# Patient Record
Sex: Male | Born: 1935 | Hispanic: No | Marital: Married | Smoking: Former smoker
Health system: Southern US, Community
[De-identification: ages and names within clinical notes are randomized; demographics above are authoritative.]

## PROBLEM LIST (undated history)

## (undated) DIAGNOSIS — I509 Heart failure, unspecified: Secondary | ICD-10-CM

## (undated) DIAGNOSIS — I459 Conduction disorder, unspecified: Secondary | ICD-10-CM

## (undated) DIAGNOSIS — N189 Chronic kidney disease, unspecified: Secondary | ICD-10-CM

## (undated) DIAGNOSIS — H9190 Unspecified hearing loss, unspecified ear: Secondary | ICD-10-CM

## (undated) DIAGNOSIS — I10 Essential (primary) hypertension: Secondary | ICD-10-CM

## (undated) HISTORY — DX: Conduction disorder, unspecified: I45.9

## (undated) HISTORY — DX: Essential (primary) hypertension: I10

## (undated) HISTORY — DX: Heart failure, unspecified: I50.9

## (undated) HISTORY — DX: Chronic kidney disease, unspecified: N18.9

## (undated) HISTORY — PX: HEMORRHOID SURGERY: SHX153

## (undated) HISTORY — DX: Unspecified hearing loss, unspecified ear: H91.90

---

## 2008-02-05 ENCOUNTER — Ambulatory Visit: Payer: Self-pay | Admitting: Internal Medicine

## 2008-02-05 ENCOUNTER — Inpatient Hospital Stay: Payer: Self-pay | Admitting: Internal Medicine

## 2008-02-05 ENCOUNTER — Other Ambulatory Visit: Payer: Self-pay

## 2008-05-24 ENCOUNTER — Ambulatory Visit: Payer: Self-pay | Admitting: Internal Medicine

## 2008-08-13 ENCOUNTER — Encounter (INDEPENDENT_AMBULATORY_CARE_PROVIDER_SITE_OTHER): Payer: Self-pay

## 2009-03-21 ENCOUNTER — Ambulatory Visit: Payer: Self-pay | Admitting: Internal Medicine

## 2009-03-21 DIAGNOSIS — Z950 Presence of cardiac pacemaker: Secondary | ICD-10-CM

## 2009-03-21 DIAGNOSIS — I110 Hypertensive heart disease with heart failure: Secondary | ICD-10-CM | POA: Insufficient documentation

## 2009-03-21 DIAGNOSIS — I442 Atrioventricular block, complete: Secondary | ICD-10-CM | POA: Insufficient documentation

## 2009-03-21 DIAGNOSIS — I509 Heart failure, unspecified: Secondary | ICD-10-CM | POA: Insufficient documentation

## 2009-06-22 ENCOUNTER — Ambulatory Visit: Payer: Self-pay | Admitting: Internal Medicine

## 2009-07-11 ENCOUNTER — Encounter: Payer: Self-pay | Admitting: Internal Medicine

## 2009-09-21 ENCOUNTER — Ambulatory Visit: Payer: Self-pay | Admitting: Internal Medicine

## 2009-10-06 ENCOUNTER — Encounter: Payer: Self-pay | Admitting: Internal Medicine

## 2009-12-22 ENCOUNTER — Ambulatory Visit: Payer: Self-pay | Admitting: Internal Medicine

## 2009-12-26 ENCOUNTER — Encounter: Payer: Self-pay | Admitting: Internal Medicine

## 2010-03-03 ENCOUNTER — Encounter (INDEPENDENT_AMBULATORY_CARE_PROVIDER_SITE_OTHER): Payer: Self-pay | Admitting: *Deleted

## 2010-05-15 ENCOUNTER — Ambulatory Visit
Admission: RE | Admit: 2010-05-15 | Discharge: 2010-05-15 | Payer: Self-pay | Source: Home / Self Care | Attending: Internal Medicine | Admitting: Internal Medicine

## 2010-06-06 NOTE — Letter (Signed)
Summary: Remote Device Check  [employer], Main Office  [address]   [place], [place] [address]   Phone: [phone]  Fax: [phone]     December 26, 2009 MRN: [id number]   [facility] [address] [place], [place]  [address]   Dear Mr. Turbyfill,   Your remote transmission was recieved and reviewed by your physician.  All diagnostics were within normal limits for you.  _____Your next transmission is scheduled for:                       .  Please transmit at any time this day.  If you have a wireless device your transmission will be sent automatically.  __X____Your next office visit is scheduled for:   November with Dr Graciela Husbands in [place]. Please call our office to schedule an appointment.    Sincerely,  Vella Kohler

## 2010-06-06 NOTE — Cardiovascular Report (Signed)
Summary: Office Visit Remote   Office Visit Remote   Imported By: Roderic Ovens 10/07/2009 10:52:55  _____________________________________________________________________  External Attachment:    Type:   Image     Comment:   External Document

## 2010-06-06 NOTE — Letter (Signed)
Summary: Remote Device Check  [employer], Main Office  [address]   [place], [place] [address]   Phone: [phone]  Fax: [phone]     October 06, 2009 MRN: [id number]   [employer] [name] [address] [place], [place]  [address]   Dear [name],   Your remote transmission was recieved and reviewed by your physician.  All diagnostics were within normal limits for you.  __X__Your next transmission is scheduled for:   12-22-2009.  Please transmit at any time this day.  If you have a wireless device your transmission will be sent automatically.   Sincerely,  Vella Kohler

## 2010-06-06 NOTE — Miscellaneous (Signed)
Summary: dx correction  Clinical Lists Changes  Problems: Changed problem from PACEMAKER (ICD-V45..01) to PACEMAKER, PERMANENT (ICD-V45.01)  changed the incorrect dx code to correct dx code 

## 2010-06-06 NOTE — Letter (Signed)
Summary: Remote Device Check  [employer], Main Office  [address]   [place], [place] [address]   Phone: [phone]  Fax: [phone]     July 11, 2009 MRN: [id number]   [facility] [address] [place], [place]  [address]   Dear Jay [name],   Your remote transmission was recieved and reviewed by your physician.  All diagnostics were within normal limits for you.  __X___Your next transmission is scheduled for:  Sep 21, 2009.  Please transmit at any time this day.  If you have a wireless device your transmission will be sent automatically.     Sincerely,  Proofreader

## 2010-06-06 NOTE — Cardiovascular Report (Signed)
Summary: Office Visit Remote   Office Visit Remote   Imported By: Roderic Ovens 12/27/2009 15:41:28  _____________________________________________________________________  External Attachment:    Type:   Image     Comment:   External Document

## 2010-06-06 NOTE — Cardiovascular Report (Signed)
Summary: Office Visit Remote   Office Visit Remote   Imported By: Roderic Ovens 07/13/2009 10:04:27  _____________________________________________________________________  External Attachment:    Type:   Image     Comment:   External Document

## 2010-06-08 NOTE — Assessment & Plan Note (Signed)
Summary: F1Y/AMD   Visit Type:  Follow-up Primary Provider:  Sharman Cheek, MD  CC:  "Doing well"..  History of Present Illness: Jay [name] is seen in followup for pacemaker implanted for high-grade heart block. He is doing quite well without complaints of chest pain or shortness of breath. There is a mild change in his exercise tolerance but this has been very gradual.  Current Medications (verified): 1)  Iron 325 (65 Fe) Mg Tabs (Ferrous Sulfate) .Marland Kitchen.. 1 By Mouth Once Daily 2)  Carvedilol 3.125 Mg Tabs (Carvedilol) .... Take One Tablet By Mouth Twice A Day 3)  Century Senior  Tabs (Multiple Vitamins-Minerals) .Marland Kitchen.. 1 By Mouth Once Daily 4)  Glipizide 10 Mg Xr24h-Tab (Glipizide) .Marland Kitchen.. 1 By Mouth Once Daily 5)  Aspirin 81 Mg Tbec (Aspirin) .... Take One Tablet By Mouth Daily 6)  Colace 100 Mg Caps (Docusate Sodium) .Marland Kitchen.. 1 By Mouth Once Daily 7)  Furosemide 20 Mg Tabs (Furosemide) .... Take One Tablet By Mouth Daily. 8)  Oscal 500/200 D-3 500-200 Mg-Unit Tabs (Calcium-Vitamin D) .Marland Kitchen.. 1 By Mouth Two Times A Day 9)  Isosorbide Mononitrate Cr 30 Mg Xr24h-Tab (Isosorbide Mononitrate) .... Take One Tablet By Mouth Daily  Allergies (verified): 1)  ! Pcn  Past History:  Past Medical History: Last updated: 03/21/2009 High-grade heart block Congestive symptoms related to high-grade heart block Status post pacer for the above.  Congestive heart failure, improved Hypertension Type II diabetes  Impaired hearing  Renal insufficiency  Past Surgical History: Last updated: 03/21/2009 Hemorrhoid surgery  Social History: Last updated: 03/21/2009 Widowed  Tobacco Use - Former.  Alcohol Use - no  Risk Factors: Alcohol Use: 0 (03/21/2009) Caffeine Use: 3-5 cups of coffee daily (03/21/2009) Exercise: yes (03/21/2009)  Risk Factors: Smoking Status: quit (03/21/2009) Packs/Day: less than pack a day (03/21/2009)  Vital Signs:  Patient profile:   77 year old male Height:      69  inches Pulse rate:   68 / minute BP sitting:   122 / 58  (left arm) Cuff size:   large  Vitals Entered By: Bishop Dublin, CMA (May 15, 2010 3:03 PM)  Physical Exam  General:  The patient was alert and oriented in no acute distress. HEENT Normal.  Neck veins were flat, carotids were brisk.  Lungs were clear.  Heart sounds were regular without murmurs or gallops.  Abdomen was soft with active bowel sounds. There is no clubbing cyanosis or edema. Skin Warm and dry    PPM Specifications Following MD:  Sherryl Manges, MD     PPM Vendor:  Medtronic     PPM Model Number:  [id number]     PPM Serial Number:  [id number] PPM DOI:  02/07/2008     PPM Implanting MD:  Sherryl Manges, MD  Lead 1    Location: RA     DOI: 02/07/2008     Model #: [id number]     Serial #: [id number]     Status: active Lead 2    Location: RV     DOI: 02/07/2008     Model #: [id number]     Serial #: [id number]     Status: active  Magnet Response Rate:  BOL 85 ERI 65  Indications:  CHB   PPM Follow Up Remote Check?  No Battery Voltage:  2.79 V     Battery Est. Longevity:  9 years     Pacer Dependent:  Yes       PPM Device Measurements Atrium  Amplitude: 2.0 mV, Impedance: 453 ohms, Threshold: 0.5 V at 0.4 msec Right Ventricle  Impedance: 493 ohms, Threshold: 0.25 V at 0.4 msec  Episodes MS Episodes:  2     Percent Mode Switch:  <0.1%     Coumadin:  No Ventricular High Rate:  0     Atrial Pacing:  6.7%     Ventricular Pacing:  99.4%  Parameters Mode:  DDD     Lower Rate Limit:  60     Upper Rate Limit:  115 Paced AV Delay:  180     Sensed AV Delay:  150 Next Remote Date:  08/17/2010     Next Cardiology Appt Due:  05/08/2011 Tech Comments:  No parameter changes.  Device function normal.  [facility] transmissions every 3 months.  ROV 1 year with Dr. Graciela Husbands in [place]. Altha Harm, LPN  May 15, 2010 3:13 PM   Impression & Recommendations:  Problem # 1:  AV BLOCK, COMPLETE (ICD-426.0) styable His updated medication  list for this problem includes:    Carvedilol 3.125 Mg Tabs (Carvedilol) .Marland Kitchen... Take one tablet by mouth twice a day    Aspirin 81 Mg Tbec (Aspirin) .Marland Kitchen... Take one tablet by mouth daily    Isosorbide Mononitrate Cr 30 Mg Xr24h-tab (Isosorbide mononitrate) .Marland Kitchen... Take one tablet by mouth daily  Problem # 2:  PACEMAKER, PERMANENT (ICD-V45.01) Device parameters and data were reviewed and no changes were made  Patient Instructions: 1)  Your physician recommends that you schedule a follow-up appointment in: 1 year 2)  Your physician recommends that you continue on your current medications as directed. Please refer to the Current Medication list given to you today.

## 2010-08-17 ENCOUNTER — Encounter: Payer: Self-pay | Admitting: *Deleted

## 2010-08-22 ENCOUNTER — Encounter: Payer: Self-pay | Admitting: *Deleted

## 2010-09-03 ENCOUNTER — Other Ambulatory Visit: Payer: Self-pay

## 2010-09-04 ENCOUNTER — Ambulatory Visit (INDEPENDENT_AMBULATORY_CARE_PROVIDER_SITE_OTHER): Payer: MEDICARE | Admitting: *Deleted

## 2010-09-04 DIAGNOSIS — I442 Atrioventricular block, complete: Secondary | ICD-10-CM

## 2010-09-04 DIAGNOSIS — Z950 Presence of cardiac pacemaker: Secondary | ICD-10-CM

## 2010-09-13 ENCOUNTER — Encounter: Payer: Self-pay | Admitting: *Deleted

## 2010-09-14 NOTE — Progress Notes (Signed)
Pacer remote check  

## 2010-09-19 NOTE — Letter (Signed)
May 24, 2008    Vonita Moss, MD  [address]  [place], [place] [address]   RE:  NHAN, QUALLEY  MRN:  [id number]  /  DOB:  11/11/1913   Dear Loraine Leriche,   Mr. Jay [name] is seen following pacemaker implantation undertaken at [facility] in  October 2009 because of high-grade heart block and secondary congestive  symptoms.  He has done beautifully since that time.  He was initially in  rehab and is now moved to a retirement center.  He has had no complaints  of chest pain or shortness of breath.  His daughter and son are  seemingly well pleased with how well he has done.   MEDICATIONS:  1. Carvedilol 3.125 b.i.d.  2. Isosorbide 30.  3. Glipizide 10.  4. Aspirin 81.   PHYSICAL EXAMINATION:  VITAL SIGNS:  His blood pressure is mildly  elevated at 146/73, the pulse was 66.  LUNGS:  Clear.  HEART:  Sounds were regular.  The pocket was well healed.  EXTREMITIES:  Without edema.   Interrogation of his Medtronic pulse generator demonstrates a P-wave of  2.8 with impedance of 480, threshold of 0.625 at 0.4.  The R-wave was  15.6 with impedance of 668, threshold of 0.5 at 0.4.  Battery voltage  2.80.  He is 99.5% ventricularly paced.   IMPRESSION:  1. High-grade heart block.  2. Congestive symptoms related to high-grade heart block.  3. Status post pacer for the above.   Mr. Overley is doing very well.  We will plan to see him again in 9 months'  time.  He will be following up with you, and if you feel necessary, with  Dr. Kirke Corin.   Thanks very much for allowing [place] to participate in his care.    Sincerely,      Duke Salvia, MD, [facility]  Electronically Signed    SCK/MedQ  DD: 05/24/2008  DT: 05/25/2008  Job #: [id number]   CC:    Lorine Bears, MD

## 2010-10-24 ENCOUNTER — Encounter: Payer: Self-pay | Admitting: Cardiovascular Disease

## 2010-12-07 ENCOUNTER — Encounter: Payer: Self-pay | Admitting: Internal Medicine

## 2010-12-07 ENCOUNTER — Ambulatory Visit (INDEPENDENT_AMBULATORY_CARE_PROVIDER_SITE_OTHER): Payer: Medicare Other | Admitting: *Deleted

## 2010-12-07 ENCOUNTER — Other Ambulatory Visit: Payer: Self-pay | Admitting: Internal Medicine

## 2010-12-07 DIAGNOSIS — I442 Atrioventricular block, complete: Secondary | ICD-10-CM

## 2010-12-07 DIAGNOSIS — Z950 Presence of cardiac pacemaker: Secondary | ICD-10-CM

## 2010-12-12 LAB — REMOTE PACEMAKER DEVICE
ATRIAL PACING PM: 5
BAMS-0001: 175 {beats}/min
BATTERY VOLTAGE: 2.79 V
RV LEAD IMPEDENCE PM: 480 Ohm
VENTRICULAR PACING PM: 100

## 2010-12-13 NOTE — Progress Notes (Signed)
Pacer remote check  

## 2010-12-27 ENCOUNTER — Encounter: Payer: Self-pay | Admitting: *Deleted

## 2011-03-15 ENCOUNTER — Other Ambulatory Visit: Payer: Self-pay | Admitting: Internal Medicine

## 2011-03-15 ENCOUNTER — Ambulatory Visit (INDEPENDENT_AMBULATORY_CARE_PROVIDER_SITE_OTHER): Payer: Medicare Other | Admitting: *Deleted

## 2011-03-15 ENCOUNTER — Encounter: Payer: Self-pay | Admitting: Internal Medicine

## 2011-03-15 DIAGNOSIS — Z950 Presence of cardiac pacemaker: Secondary | ICD-10-CM

## 2011-03-15 DIAGNOSIS — I442 Atrioventricular block, complete: Secondary | ICD-10-CM

## 2011-03-18 LAB — REMOTE PACEMAKER DEVICE
AL AMPLITUDE: 0.8 mv
BAMS-0001: 175 {beats}/min
RV LEAD IMPEDENCE PM: 469 Ohm
RV LEAD THRESHOLD: 0.75 V

## 2011-03-23 NOTE — Progress Notes (Signed)
Remote pacer check  

## 2011-05-11 ENCOUNTER — Encounter: Payer: Self-pay | Admitting: *Deleted

## 2011-07-03 ENCOUNTER — Encounter: Payer: Medicare Other | Admitting: Internal Medicine

## 2011-07-03 ENCOUNTER — Encounter: Payer: Self-pay | Admitting: Internal Medicine

## 2011-07-03 ENCOUNTER — Ambulatory Visit (INDEPENDENT_AMBULATORY_CARE_PROVIDER_SITE_OTHER): Payer: Medicare Other | Admitting: Internal Medicine

## 2011-07-03 DIAGNOSIS — I110 Hypertensive heart disease with heart failure: Secondary | ICD-10-CM

## 2011-07-03 DIAGNOSIS — Z950 Presence of cardiac pacemaker: Secondary | ICD-10-CM | POA: Insufficient documentation

## 2011-07-03 DIAGNOSIS — I509 Heart failure, unspecified: Secondary | ICD-10-CM

## 2011-07-03 DIAGNOSIS — I442 Atrioventricular block, complete: Secondary | ICD-10-CM

## 2011-07-03 LAB — PACEMAKER DEVICE OBSERVATION
AL AMPLITUDE: 4 mv
AL IMPEDENCE PM: 459 Ohm
BATTERY VOLTAGE: 2.79 V
RV LEAD IMPEDENCE PM: 485 Ohm
VENTRICULAR PACING PM: 100

## 2011-07-03 NOTE — Progress Notes (Signed)
HPI Mr. Jay [name] returns today for followup. He is a very pleasant 78 year old man with a history of complete heart block status post permanent pacemaker insertion, hypertension, and diastolic heart failure. In the interim, the patient has done well. He continues to remain independent. He denies chest pain, shortness of breath, or peripheral edema. No change in appetite. Allergies  Allergen Reactions  . Penicillins      Current Outpatient Prescriptions  Medication Sig Dispense Refill  . aspirin 81 MG EC tablet Take 81 mg by mouth daily.        . calcium-vitamin D (OSCAL WITH D) 500-200 MG-UNIT per tablet Take 1 tablet by mouth 2 (two) times daily.        . carvedilol (COREG) 3.125 MG tablet Take 3.125 mg by mouth 2 (two) times daily.        . colesevelam (WELCHOL) 625 MG tablet Take 3 tablets in the am & 3 tablets in the pm      . docusate sodium (COLACE) 100 MG capsule Take 100 mg by mouth daily.        . ferrous sulfate 325 (65 FE) MG tablet Take 325 mg by mouth daily.        . furosemide (LASIX) 20 MG tablet Take 20 mg by mouth daily.        Marland Kitchen glipiZIDE (GLUCOTROL) 10 MG 24 hr tablet Take 10 mg by mouth daily.        . isosorbide mononitrate (IMDUR) 30 MG 24 hr tablet Take 30 mg by mouth daily.        . Multiple Vitamins-Minerals (CENTURY SENIOR) TABS Take 1 tablet by mouth daily.        . sitaGLIPtin (JANUVIA) 100 MG tablet Take 100 mg by mouth daily.         Past Medical History  Diagnosis Date  . Hypertension   . Diabetes mellitus     Type II  . Chronic kidney disease     Renal insufficiency  . Heart block     High-grade; congestive symptoms related to high-grade heart block s/p pacer  . CHF (congestive heart failure)     Improved  . Impaired hearing     ROS:   All systems reviewed and negative except as noted in the HPI.   Past Surgical History  Procedure Date  . Hemorrhoid surgery      History reviewed. No pertinent family history.   History   Social History    . Marital Status: Married    Spouse Name: N/A    Number of Children: N/A  . Years of Education: N/A   Occupational History  . Not on file.   Social History Main Topics  . Smoking status: Former Games developer  . Smokeless tobacco: Not on file  . Alcohol Use: No  . Drug Use: No  . Sexually Active:    Other Topics Concern  . Not on file   Social History Narrative   Widowed.     BP 142/66  Pulse 73  Ht 5\' 9"  (1.753 m)  Wt 90.719 kg (200 lb)  BMI 29.53 kg/m2  Physical Exam:  Well appearing elderly man, NAD HEENT: Unremarkable Neck:  No JVD, no thyromegally Lymphatics:  No adenopathy Back:  No CVA tenderness Lungs:  Clear with no wheezes, rales, or rhonchi. Well-healed pacemaker incision. HEART:  Regular rate rhythm, no murmurs, no rubs, no clicks Abd:  soft, positive bowel sounds, no organomegally, no rebound, no guarding Ext:  2 plus  pulses, no edema, no cyanosis, no clubbing Skin:  No rashes no nodules Neuro:  CN II through XII intact, motor grossly intact  EKG Normal sinus rhythm with ventricular pacing.  DEVICE  Normal device function.  See PaceArt for details.   Assess/Plan:

## 2011-07-03 NOTE — Assessment & Plan Note (Signed)
His blood pressure is fairly well controlled. He is instructed to maintain a low-sodium diet.

## 2011-07-03 NOTE — Assessment & Plan Note (Signed)
The device is working normally. We'll plan to recheck in several months.

## 2011-07-03 NOTE — Patient Instructions (Addendum)
Your physician wants you to follow-up in: 1 YEAR WITH DR. Graciela Husbands IN THE [facility] OFFICE. You will receive a reminder letter in the mail two months in advance. If you don't receive a letter, please call our office to schedule the follow-up appointment.  NO CHANGES TODAY PER DR. Ladona Ridgel  SEND REMOTE THROUGH [facility] ON MAY 30 TH, 2013 PER PAULA ODELL

## 2011-10-04 ENCOUNTER — Ambulatory Visit (INDEPENDENT_AMBULATORY_CARE_PROVIDER_SITE_OTHER): Payer: Medicare Other | Admitting: *Deleted

## 2011-10-04 ENCOUNTER — Encounter: Payer: Self-pay | Admitting: Internal Medicine

## 2011-10-04 DIAGNOSIS — Z950 Presence of cardiac pacemaker: Secondary | ICD-10-CM

## 2011-10-04 DIAGNOSIS — I442 Atrioventricular block, complete: Secondary | ICD-10-CM

## 2011-10-08 LAB — REMOTE PACEMAKER DEVICE
AL AMPLITUDE: 2.8 mv
ATRIAL PACING PM: 6
BAMS-0001: 175 {beats}/min
VENTRICULAR PACING PM: 100

## 2011-10-12 ENCOUNTER — Encounter: Payer: Self-pay | Admitting: *Deleted

## 2012-01-10 ENCOUNTER — Ambulatory Visit (INDEPENDENT_AMBULATORY_CARE_PROVIDER_SITE_OTHER): Payer: Medicare Other | Admitting: *Deleted

## 2012-01-10 ENCOUNTER — Encounter: Payer: Self-pay | Admitting: Internal Medicine

## 2012-01-10 DIAGNOSIS — I442 Atrioventricular block, complete: Secondary | ICD-10-CM

## 2012-01-10 DIAGNOSIS — Z950 Presence of cardiac pacemaker: Secondary | ICD-10-CM

## 2012-01-14 LAB — REMOTE PACEMAKER DEVICE
AL AMPLITUDE: 2.8 mv
AL THRESHOLD: 0.5 V
BAMS-0001: 175 {beats}/min
RV LEAD THRESHOLD: 1 V

## 2012-02-19 ENCOUNTER — Encounter: Payer: Self-pay | Admitting: *Deleted

## 2012-04-21 ENCOUNTER — Ambulatory Visit (INDEPENDENT_AMBULATORY_CARE_PROVIDER_SITE_OTHER): Payer: Medicare Other | Admitting: *Deleted

## 2012-04-21 ENCOUNTER — Encounter: Payer: Self-pay | Admitting: Internal Medicine

## 2012-04-21 DIAGNOSIS — I442 Atrioventricular block, complete: Secondary | ICD-10-CM

## 2012-04-21 DIAGNOSIS — Z950 Presence of cardiac pacemaker: Secondary | ICD-10-CM

## 2012-05-04 LAB — REMOTE PACEMAKER DEVICE
AL AMPLITUDE: 2.8 mv
AL IMPEDENCE PM: 472 Ohm
BATTERY VOLTAGE: 2.78 V
RV LEAD IMPEDENCE PM: 475 Ohm
VENTRICULAR PACING PM: 100

## 2012-05-13 ENCOUNTER — Encounter: Payer: Self-pay | Admitting: *Deleted

## 2012-06-07 ENCOUNTER — Ambulatory Visit: Payer: Self-pay | Admitting: Internal Medicine

## 2012-06-18 ENCOUNTER — Inpatient Hospital Stay: Payer: Self-pay | Admitting: Internal Medicine

## 2012-06-18 LAB — CBC WITH DIFFERENTIAL/PLATELET
Eosinophil #: 0 10*3/uL (ref 0.0–0.7)
Eosinophil %: 0 %
HCT: 40.9 % (ref 40.0–52.0)
Lymphocyte #: 3.4 10*3/uL (ref 1.0–3.6)
Lymphocyte %: 20 %
MCH: 30.6 pg (ref 26.0–34.0)
MCHC: 33.3 g/dL (ref 32.0–36.0)
RBC: 4.44 10*6/uL (ref 4.40–5.90)
RDW: 14.6 % — ABNORMAL HIGH (ref 11.5–14.5)
WBC: 17.1 10*3/uL — ABNORMAL HIGH (ref 3.8–10.6)

## 2012-06-18 LAB — TROPONIN I: Troponin-I: 0.13 ng/mL — ABNORMAL HIGH

## 2012-06-18 LAB — URINALYSIS, COMPLETE
Bilirubin,UR: NEGATIVE
Glucose,UR: NEGATIVE mg/dL (ref 0–75)
Hyaline Cast: 1
RBC,UR: 9 /HPF (ref 0–5)
Squamous Epithelial: <1
WBC UR: 20 /HPF (ref 0–5)

## 2012-06-18 LAB — COMPREHENSIVE METABOLIC PANEL
Anion Gap: 9 (ref 7–16)
Bilirubin,Total: 1 mg/dL (ref 0.2–1.0)
Chloride: 107 mmol/L (ref 98–107)
Osmolality: 285 (ref 275–301)
SGOT(AST): 76 U/L — ABNORMAL HIGH (ref 15–37)
Sodium: 139 mmol/L (ref 136–145)

## 2012-06-18 LAB — PRO B NATRIURETIC PEPTIDE: B-Type Natriuretic Peptide: 7042 pg/mL — ABNORMAL HIGH (ref 0–450)

## 2012-06-18 LAB — CK: CK, Total: 1216 U/L — ABNORMAL HIGH (ref 35–232)

## 2012-06-19 LAB — CBC WITH DIFFERENTIAL/PLATELET
Basophil #: 0.2 10*3/uL — ABNORMAL HIGH (ref 0.0–0.1)
Basophil %: 1.3 %
Eosinophil #: 0.1 10*3/uL (ref 0.0–0.7)
HGB: 12.6 g/dL — ABNORMAL LOW (ref 13.0–18.0)
Lymphocyte #: 3.8 10*3/uL — ABNORMAL HIGH (ref 1.0–3.6)
Lymphocyte %: 23.9 %
MCH: 30.5 pg (ref 26.0–34.0)
MCHC: 33.1 g/dL (ref 32.0–36.0)
MCV: 92 fL (ref 80–100)
Monocyte %: 8.4 %
Neutrophil #: 10.5 10*3/uL — ABNORMAL HIGH (ref 1.4–6.5)
Platelet: 114 10*3/uL — ABNORMAL LOW (ref 150–440)
RDW: 14.4 % (ref 11.5–14.5)

## 2012-06-19 LAB — COMPREHENSIVE METABOLIC PANEL
Alkaline Phosphatase: 61 U/L (ref 50–136)
Anion Gap: 12 (ref 7–16)
BUN: 23 mg/dL — ABNORMAL HIGH (ref 7–18)
Bilirubin,Total: 0.8 mg/dL (ref 0.2–1.0)
Chloride: 111 mmol/L — ABNORMAL HIGH (ref 98–107)
Creatinine: 1.46 mg/dL — ABNORMAL HIGH (ref 0.60–1.30)
EGFR (Non-African Amer.): 40 — ABNORMAL LOW
Osmolality: 294 (ref 275–301)
Potassium: 3.7 mmol/L (ref 3.5–5.1)
SGOT(AST): 65 U/L — ABNORMAL HIGH (ref 15–37)
SGPT (ALT): 26 U/L (ref 12–78)
Total Protein: 7.2 g/dL (ref 6.4–8.2)

## 2012-06-19 LAB — PROTIME-INR: INR: 1.1

## 2012-06-19 LAB — TROPONIN I: Troponin-I: 0.12 ng/mL — ABNORMAL HIGH

## 2012-06-20 DIAGNOSIS — I519 Heart disease, unspecified: Secondary | ICD-10-CM

## 2012-06-20 LAB — BASIC METABOLIC PANEL
Anion Gap: 11 (ref 7–16)
BUN: 30 mg/dL — ABNORMAL HIGH (ref 7–18)
Calcium, Total: 8.9 mg/dL (ref 8.5–10.1)
Chloride: 110 mmol/L — ABNORMAL HIGH (ref 98–107)
Co2: 22 mmol/L (ref 21–32)
EGFR (African American): 45 — ABNORMAL LOW
Osmolality: 293 (ref 275–301)
Sodium: 143 mmol/L (ref 136–145)

## 2012-06-20 LAB — CBC WITH DIFFERENTIAL/PLATELET
Basophil #: 0 10*3/uL (ref 0.0–0.1)
Basophil %: 0.4 %
Eosinophil #: 0.1 10*3/uL (ref 0.0–0.7)
HCT: 36.9 % — ABNORMAL LOW (ref 40.0–52.0)
MCHC: 33.2 g/dL (ref 32.0–36.0)
Monocyte #: 1 x10 3/mm (ref 0.2–1.0)
Neutrophil #: 7.1 10*3/uL — ABNORMAL HIGH (ref 1.4–6.5)
Neutrophil %: 62 %
Platelet: 98 10*3/uL — ABNORMAL LOW (ref 150–440)
RDW: 14.7 % — ABNORMAL HIGH (ref 11.5–14.5)

## 2012-06-20 LAB — URINE CULTURE

## 2012-06-20 LAB — CK: CK, Total: 348 U/L — ABNORMAL HIGH (ref 35–232)

## 2012-06-21 LAB — BASIC METABOLIC PANEL
Anion Gap: 9 (ref 7–16)
Calcium, Total: 8.6 mg/dL (ref 8.5–10.1)
Chloride: 117 mmol/L — ABNORMAL HIGH (ref 98–107)
EGFR (Non-African Amer.): 48 — ABNORMAL LOW
Glucose: 97 mg/dL (ref 65–99)
Osmolality: 300 (ref 275–301)
Potassium: 4 mmol/L (ref 3.5–5.1)
Sodium: 148 mmol/L — ABNORMAL HIGH (ref 136–145)

## 2012-06-21 LAB — CBC WITH DIFFERENTIAL/PLATELET
Basophil #: 0 10*3/uL (ref 0.0–0.1)
Eosinophil #: 0.2 10*3/uL (ref 0.0–0.7)
Eosinophil %: 2.6 %
Lymphocyte %: 29 %
MCV: 93 fL (ref 80–100)
Monocyte #: 0.7 x10 3/mm (ref 0.2–1.0)
Monocyte %: 7.8 %
Neutrophil #: 5 10*3/uL (ref 1.4–6.5)
Neutrophil %: 60 %
RBC: 3.84 10*6/uL — ABNORMAL LOW (ref 4.40–5.90)
RDW: 14.5 % (ref 11.5–14.5)

## 2012-06-22 LAB — CBC WITH DIFFERENTIAL/PLATELET
Basophil #: 0 10*3/uL (ref 0.0–0.1)
Eosinophil #: 0.4 10*3/uL (ref 0.0–0.7)
HCT: 35.7 % — ABNORMAL LOW (ref 40.0–52.0)
HGB: 12 g/dL — ABNORMAL LOW (ref 13.0–18.0)
Lymphocyte #: 2.6 10*3/uL (ref 1.0–3.6)
Lymphocyte %: 31.7 %
MCH: 31.1 pg (ref 26.0–34.0)
MCV: 93 fL (ref 80–100)
Monocyte %: 9.2 %
Neutrophil %: 53.9 %
RDW: 14.7 % — ABNORMAL HIGH (ref 11.5–14.5)
WBC: 8.2 10*3/uL (ref 3.8–10.6)

## 2012-06-22 LAB — BASIC METABOLIC PANEL
Anion Gap: 9 (ref 7–16)
Chloride: 116 mmol/L — ABNORMAL HIGH (ref 98–107)
Co2: 22 mmol/L (ref 21–32)
Creatinine: 1.12 mg/dL (ref 0.60–1.30)
EGFR (African American): >60
Glucose: 99 mg/dL (ref 65–99)
Osmolality: 297 (ref 275–301)
Potassium: 3.7 mmol/L (ref 3.5–5.1)
Sodium: 147 mmol/L — ABNORMAL HIGH (ref 136–145)

## 2012-06-23 LAB — VANCOMYCIN, TROUGH: Vancomycin, Trough: 13 ug/mL (ref 10–20)

## 2012-06-23 LAB — CULTURE, BLOOD (SINGLE)

## 2012-06-24 LAB — URINALYSIS, COMPLETE
Glucose,UR: 50 mg/dL (ref 0–75)
Leukocyte Esterase: NEGATIVE
Nitrite: NEGATIVE
Ph: 6 (ref 4.5–8.0)
Protein: 100
RBC,UR: 6589 /HPF (ref 0–5)
Squamous Epithelial: NONE SEEN

## 2012-07-05 ENCOUNTER — Ambulatory Visit: Payer: Self-pay | Admitting: Internal Medicine

## 2012-08-05 DEATH — deceased

## 2013-05-31 IMAGING — CR PELVIS - 1-2 VIEW
1 series · 2 of 2 positions shown · non-contrast
Comparison: none

REASON FOR EXAM: possible fall injury
COMMENTS:

PROCEDURE:     DXR - DXR PELVIS AP ONLY  - June 18, 2012  [DATE]
RESULT:     Images of the pelvis demonstrate degenerative changes in the
hips, lumbosacral region and sacroiliac joints without evidence of a
fracture.

[Series 1: t pelvis ap · 0.14mm/px · 2 of 2 slices shown]
[im 1/2]
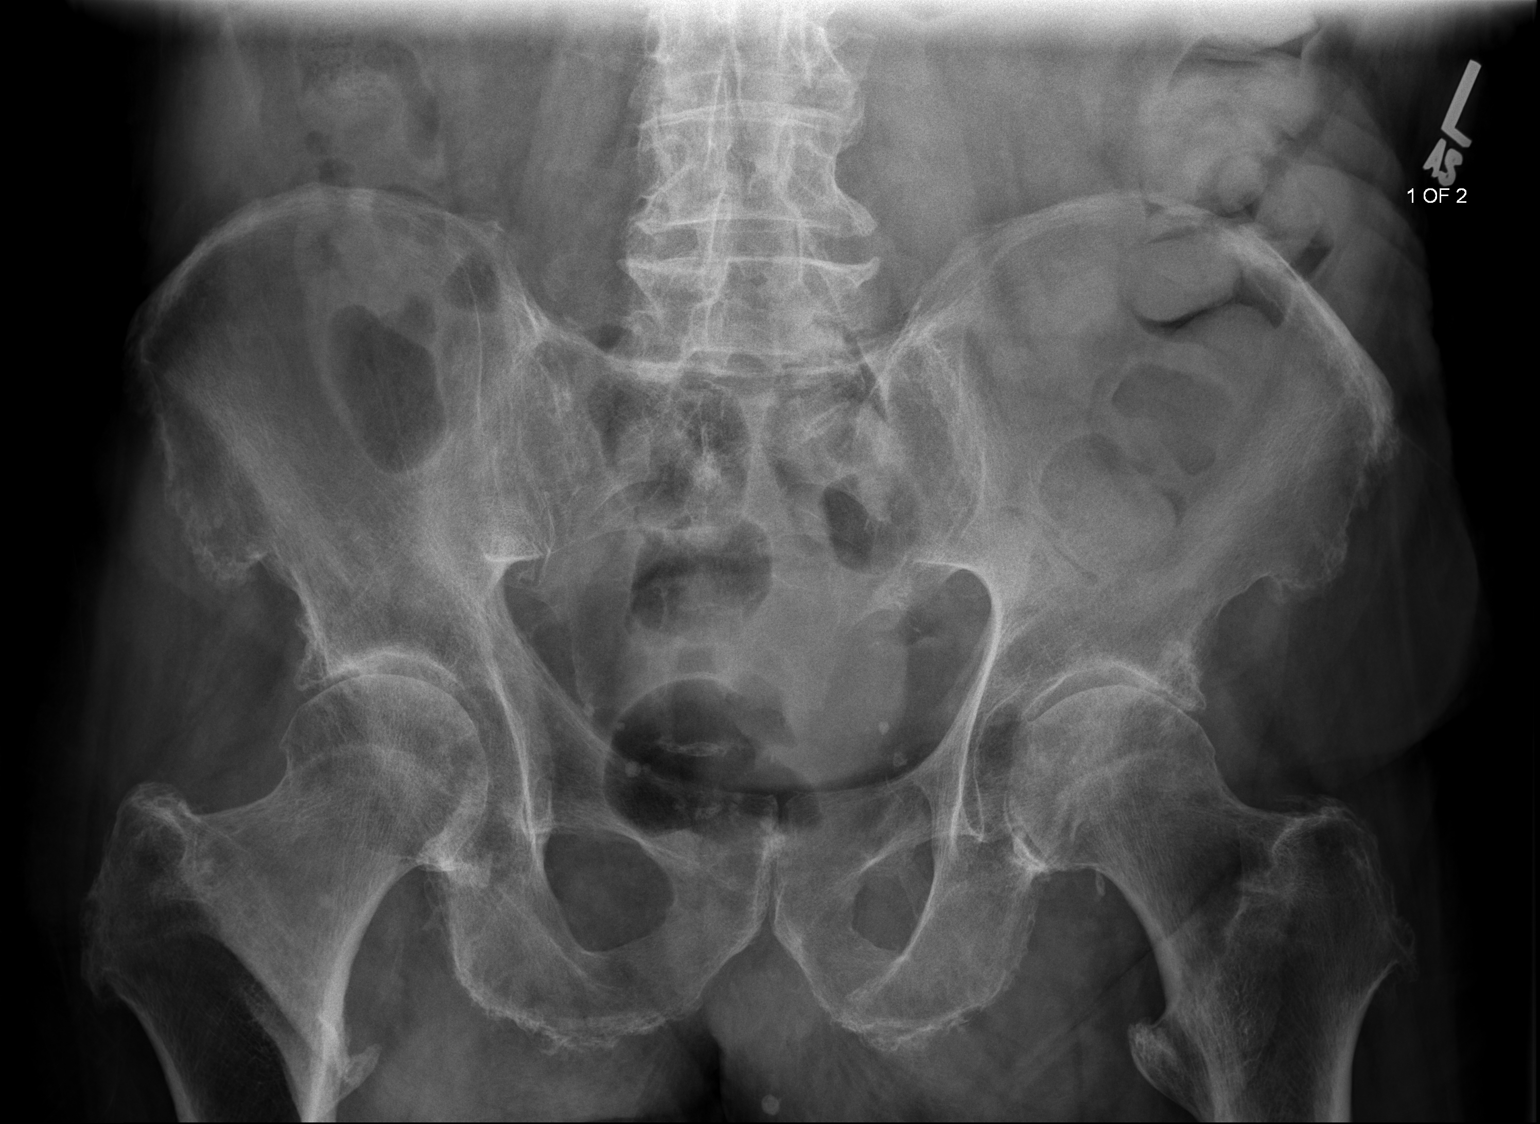
[im 2/2]
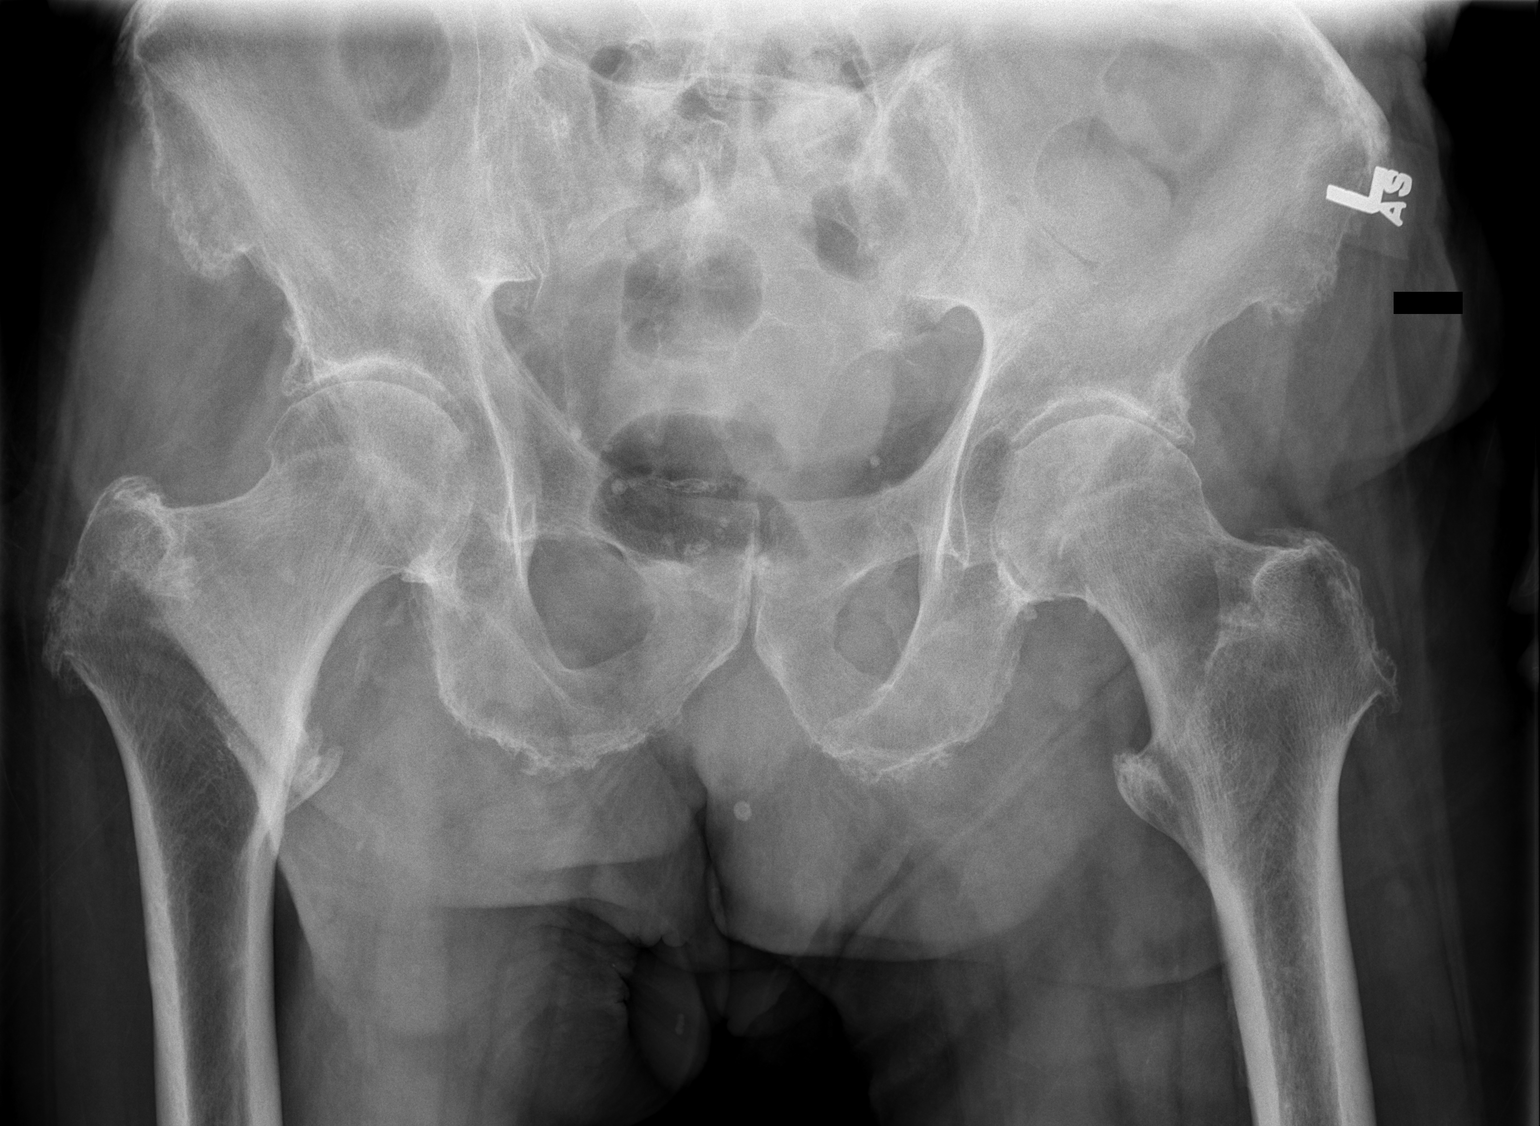

[2 of 2 positions shown; findings below may reference images not displayed]

IMPRESSION: Please see above.

[REDACTED]

## 2013-05-31 IMAGING — CR RIGHT HIP - 1 VIEW
1 series · 1 of 1 positions shown · non-contrast
Comparison: none

REASON FOR EXAM: hip pain
COMMENTS:

PROCEDURE:     DXR - DXR HIP RIGHT ONE VIEW  - June 18, 2012  [DATE]
RESULT:     Frog-leg view of the right hip is performed. Degenerative
changes are seen without a definite fracture evident. An AP view was not
performed and should be considered for further assessment.

[t hip lat right]
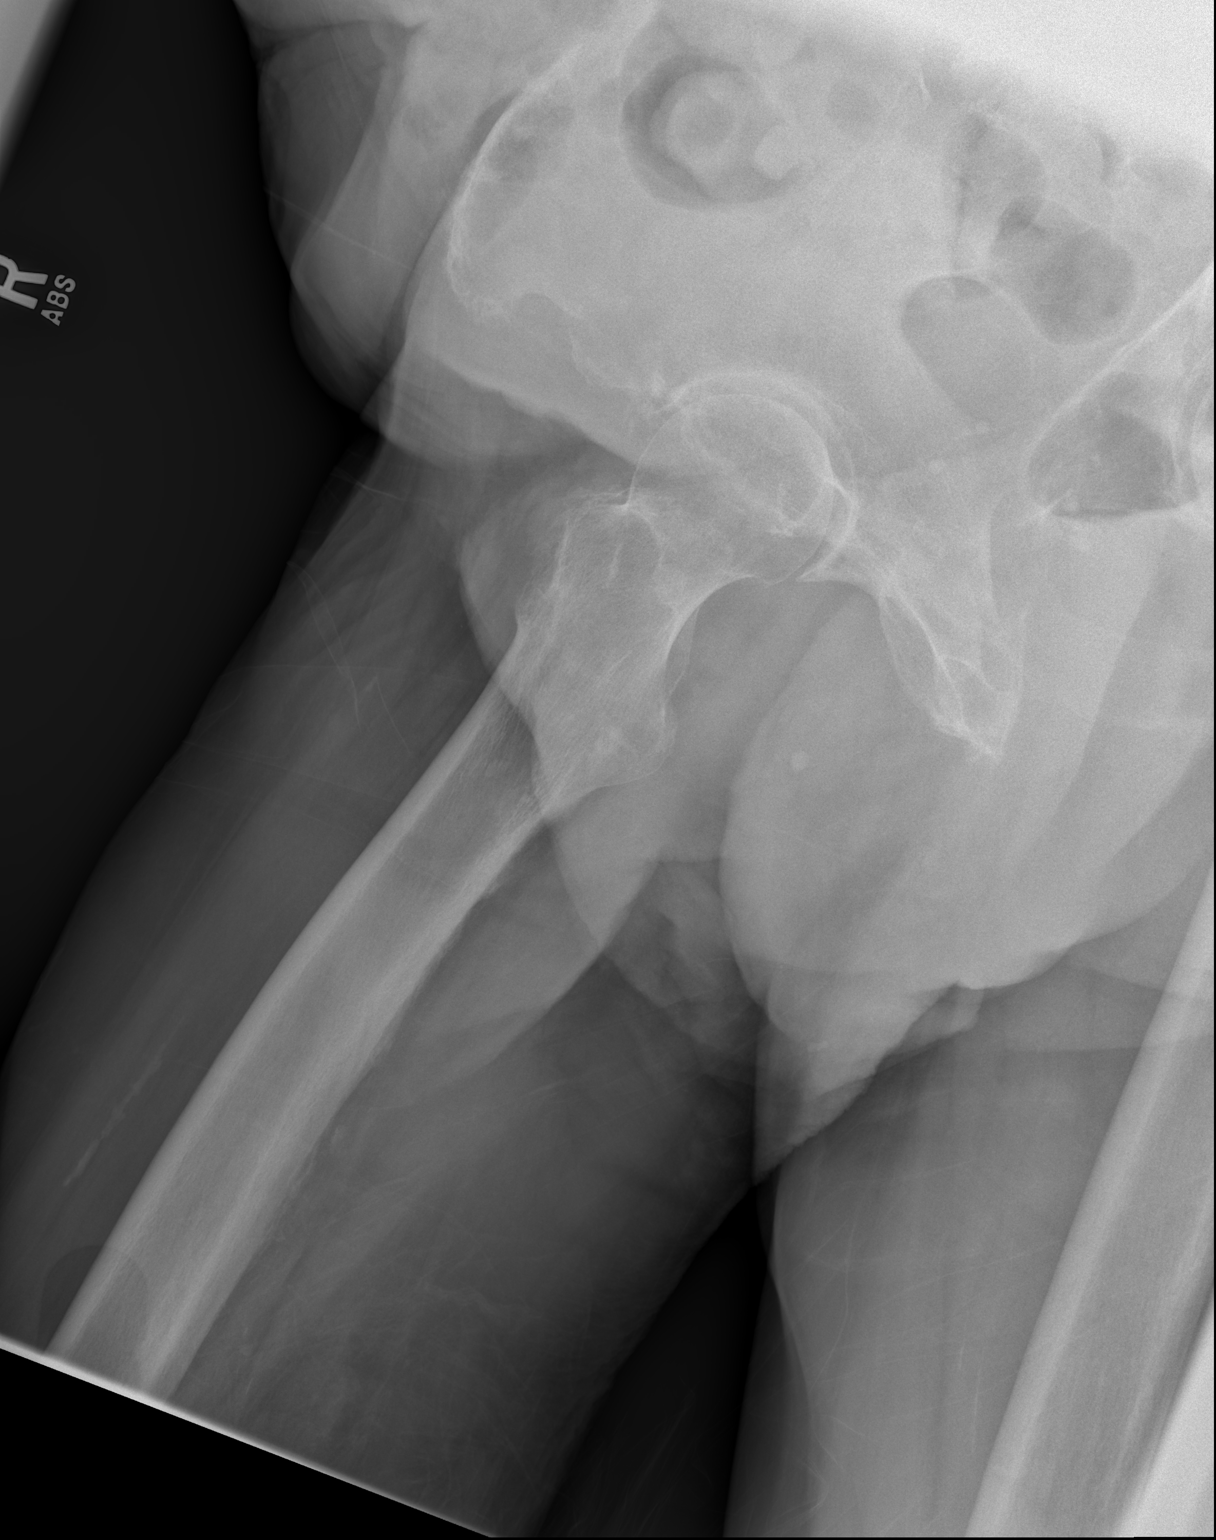

[1 of 1 positions shown; findings below may reference images not displayed]

IMPRESSION: Please see above.

[REDACTED]

## 2013-06-02 IMAGING — US US RENAL KIDNEY
1 series · 14 of 25 positions shown · non-contrast
Comparison: none

REASON FOR EXAM: renal failure,  hematuria
COMMENTS:

[Series 1: us renal kidney · 0.28mm/px · 14 of 47 slices shown]
[im 1/47]
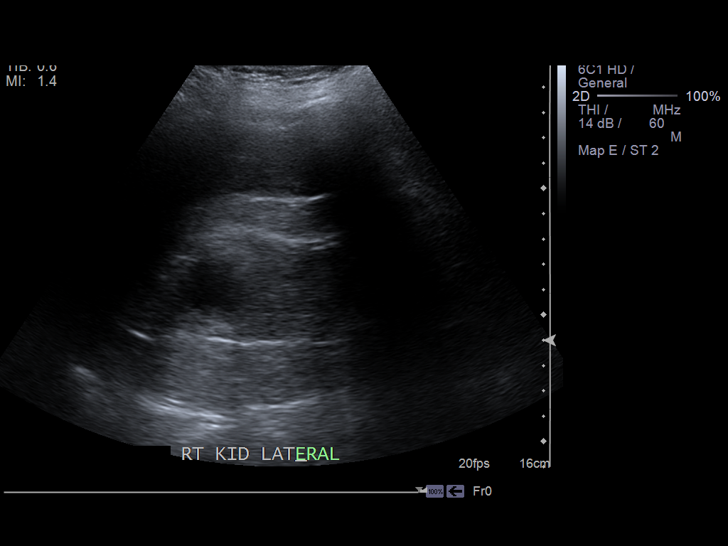
[im 4/47]
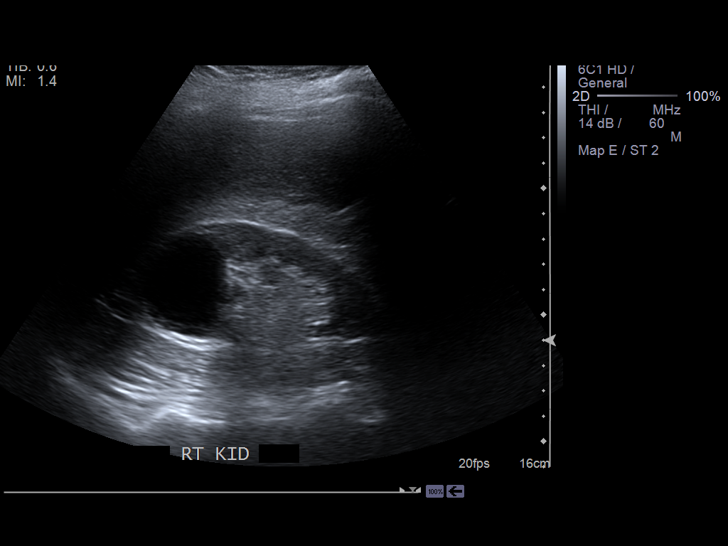
[im 8/47]
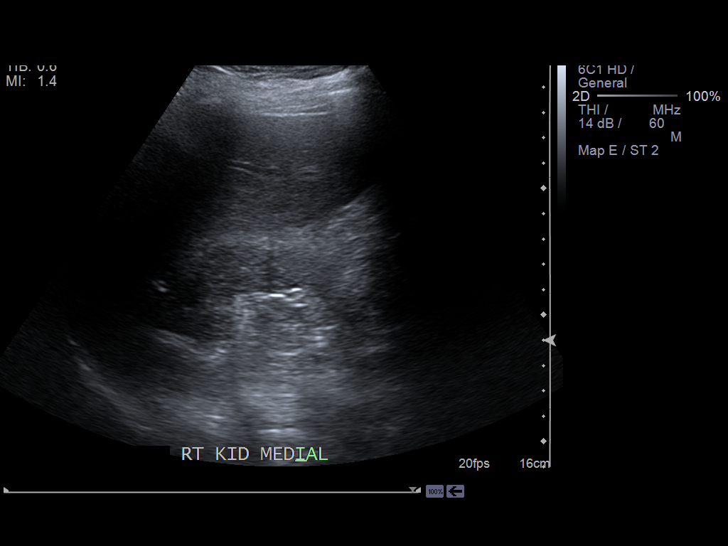
[im 12/47]
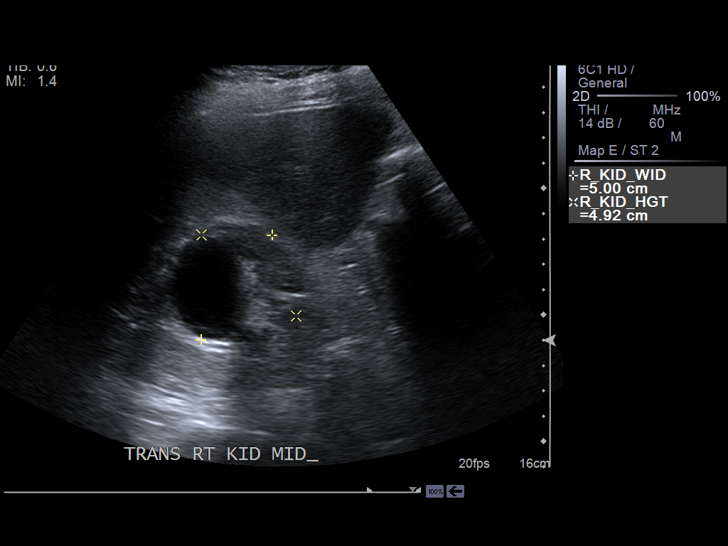
[im 16/47]
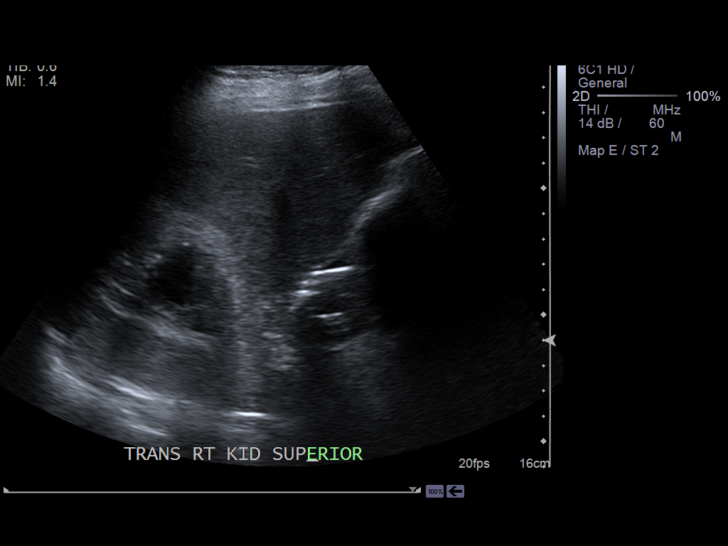
[im 18/47]
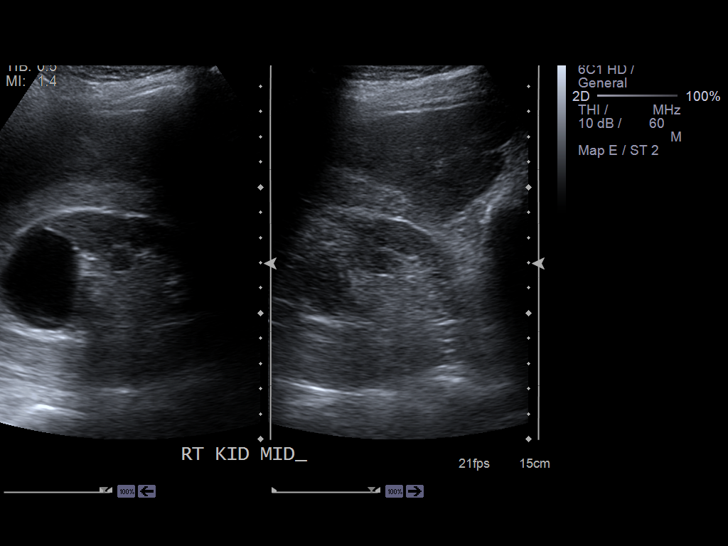
[im 22/47]
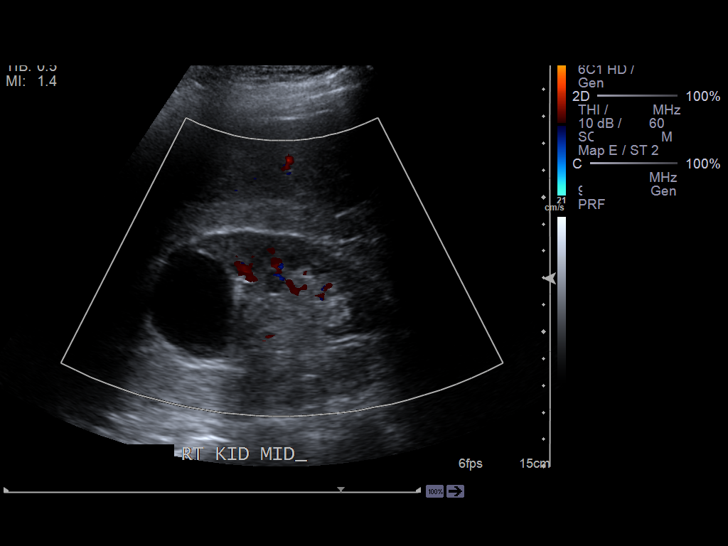
[im 25/47]
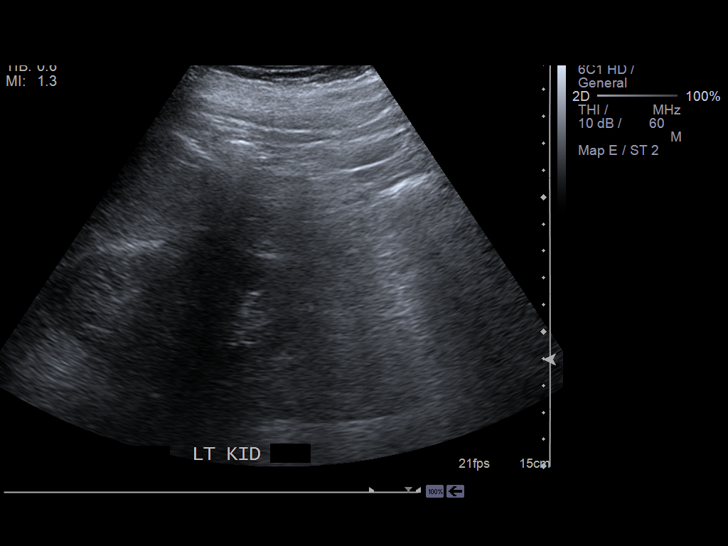
[im 29/47]
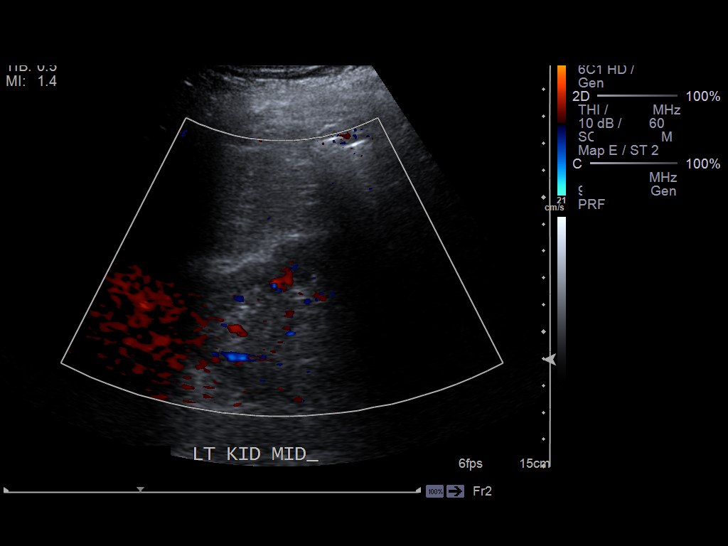
[im 31/47]
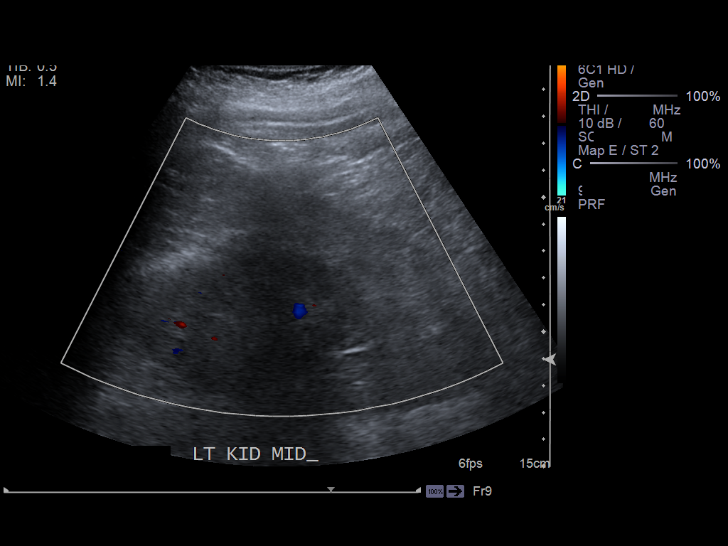
[im 35/47]
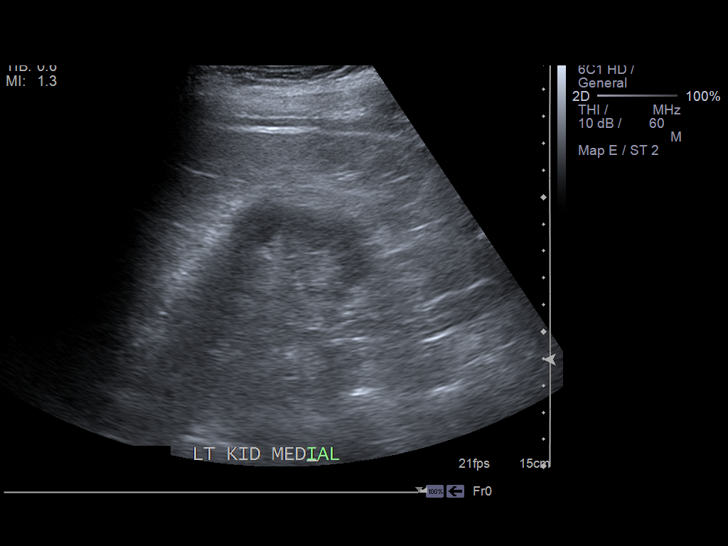
[im 39/47]
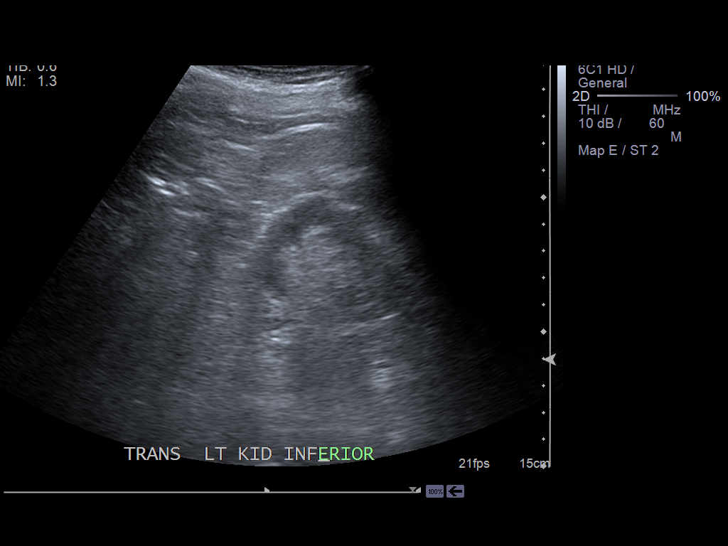
[im 43/47]
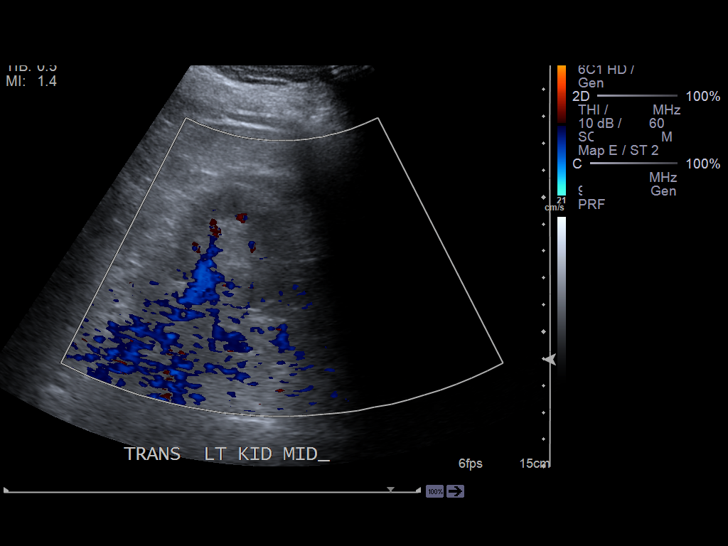
[im 47/47]
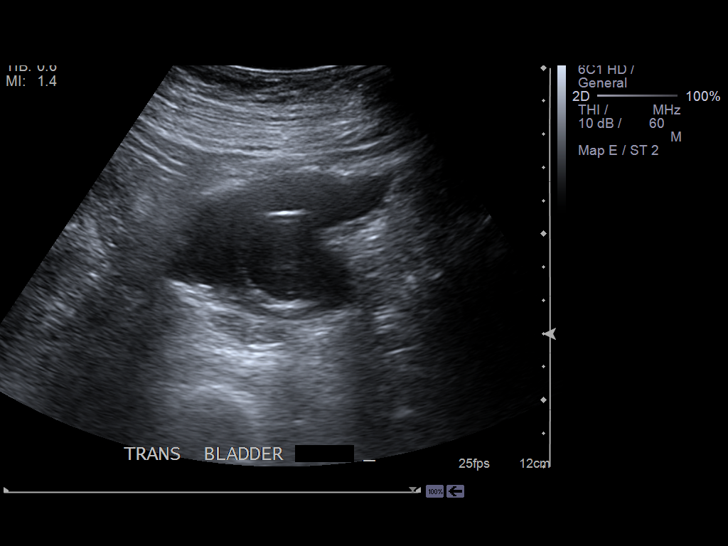

[14 of 25 positions shown; findings below may reference images not displayed]

PROCEDURE:     US  - US KIDNEY  - June 20, 2012 [DATE]

RESULT:     Sonographic interrogation of the kidneys demonstrates the right
kidney measures 9.99 x 5.00 x 4.92 cm with an upper pole 4.11 x 3.39 x
cm cyst. There is a smaller mid right renal cyst of 8.7 x 8.8 x 8.2 mm. The
left kidney measures 10.28 x 5.0 x 4.66 cm. There is a Foley balloon
visualized in the nondistended urinary bladder.
IMPRESSION: 1. Cysts in the right kidney. No obstructive change or solid mass. No stones
are evident.

[REDACTED]

## 2013-06-02 IMAGING — US ABDOMEN ULTRASOUND LIMITED
1 series · 14 of 25 positions shown · non-contrast
Comparison: none

REASON FOR EXAM: RUQ pain on palpitation
COMMENTS:   Body Site: GB and Fossa, CBD, Head of Pancreas; Right Upper Quad

[Series 1: abdomen ultrasound limited · 0.31mm/px · 14 of 40 slices shown]
[im 1/40]
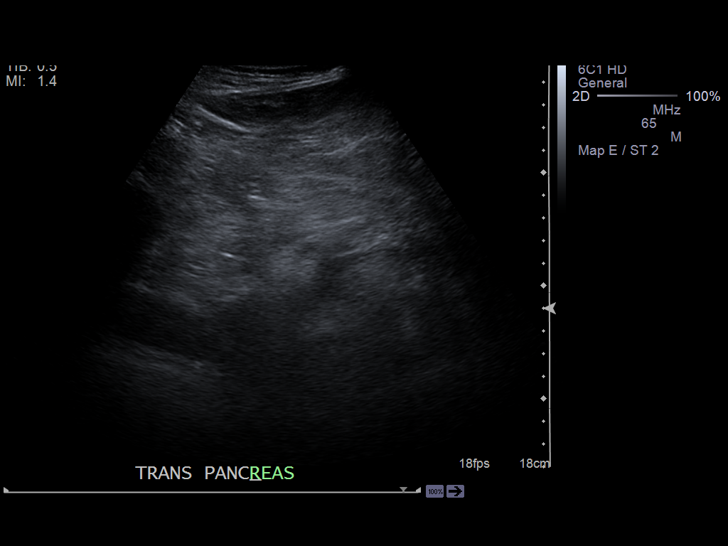
[im 4/40]
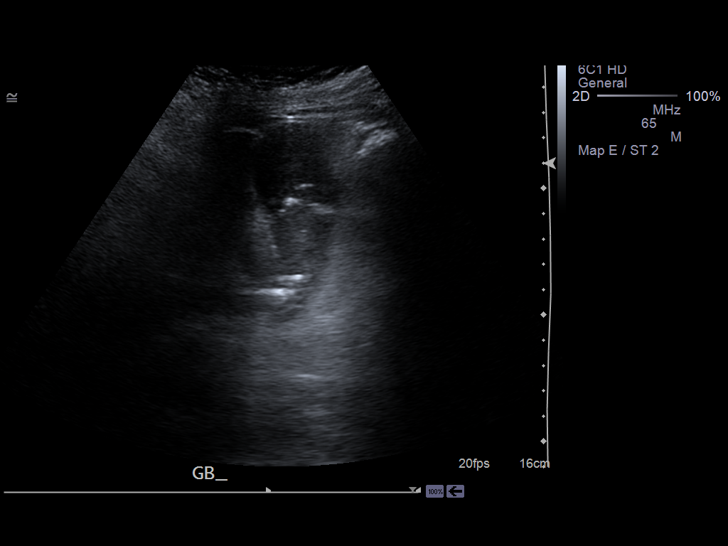
[im 7/40]
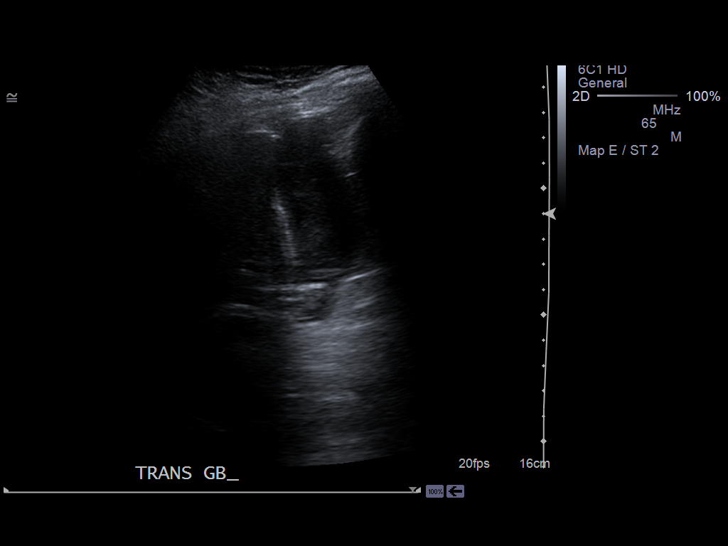
[im 10/40]
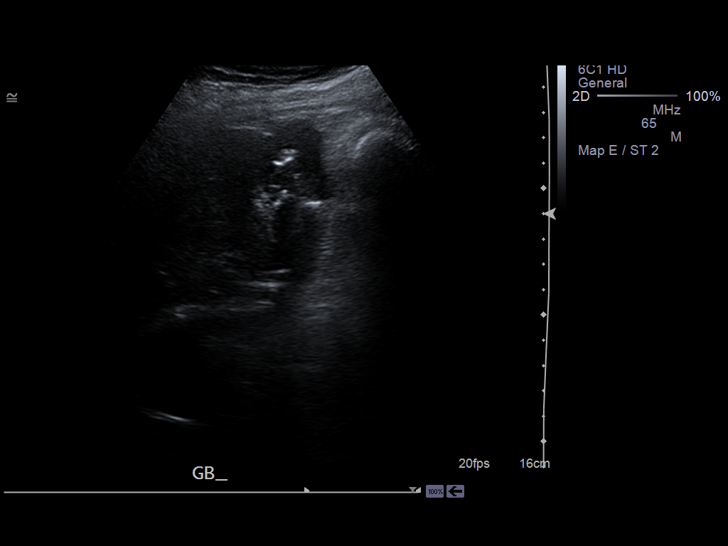
[im 14/40]
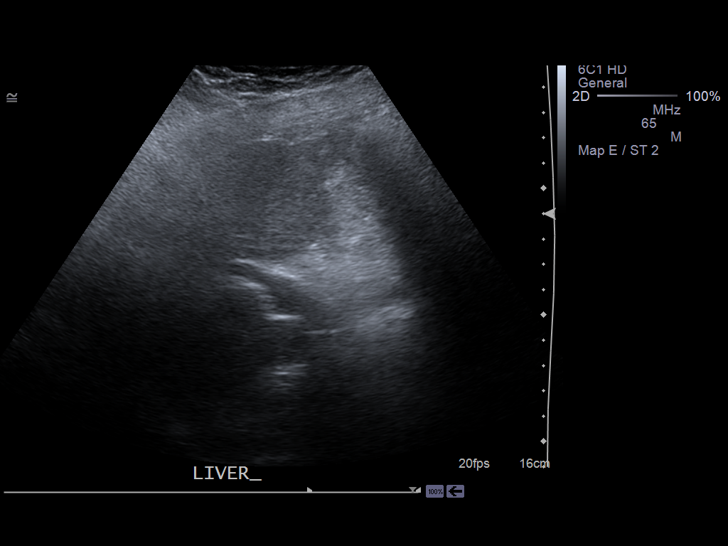
[im 15/40]
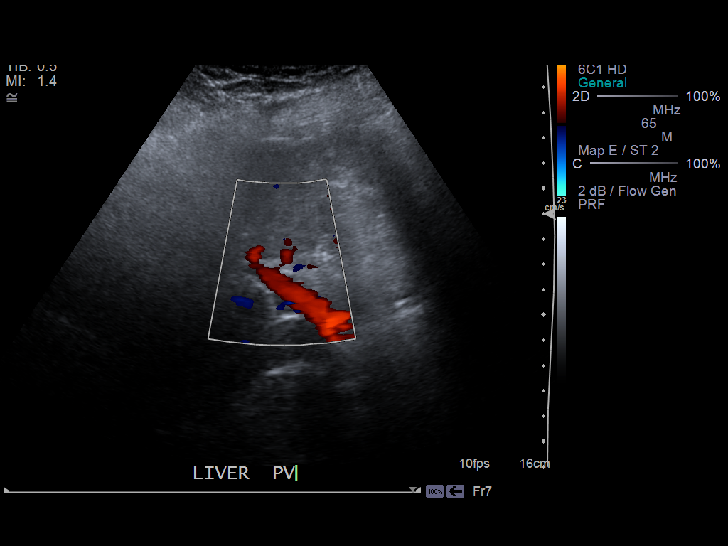
[im 18/40]
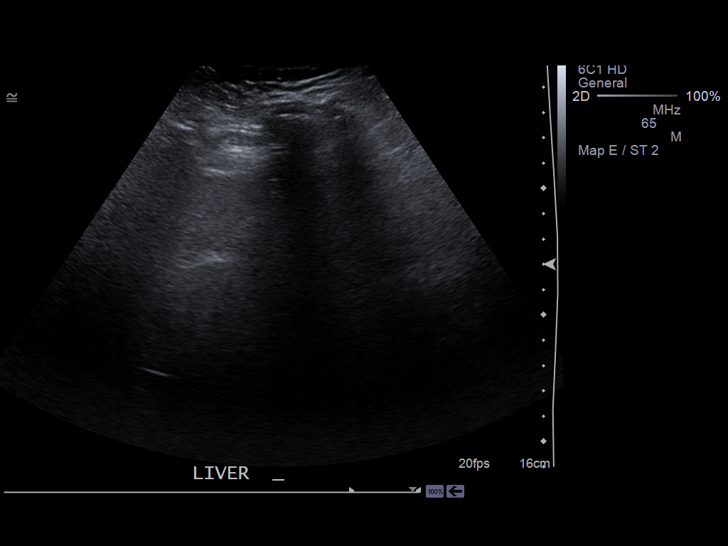
[im 22/40]
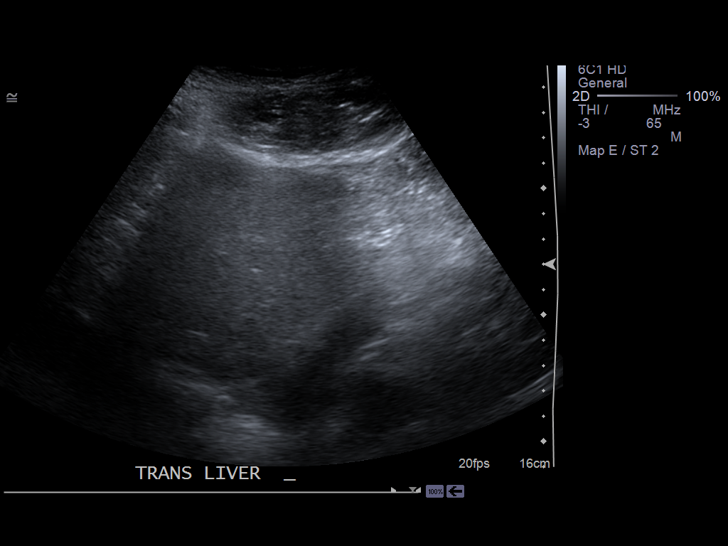
[im 25/40]
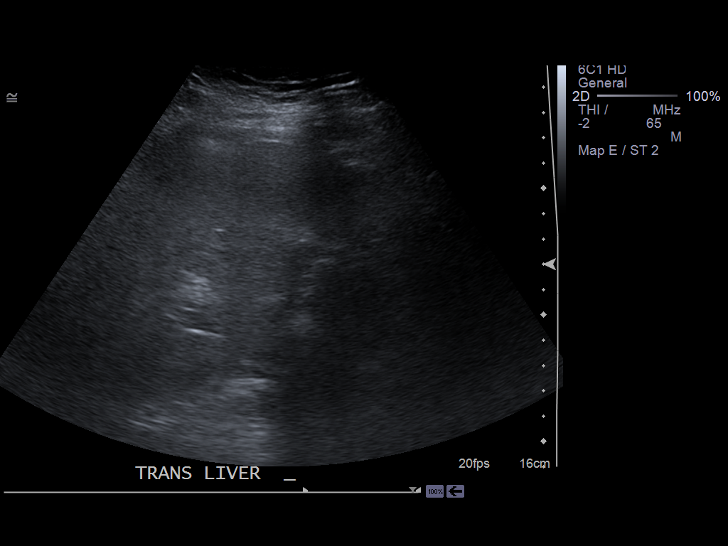
[im 27/40]
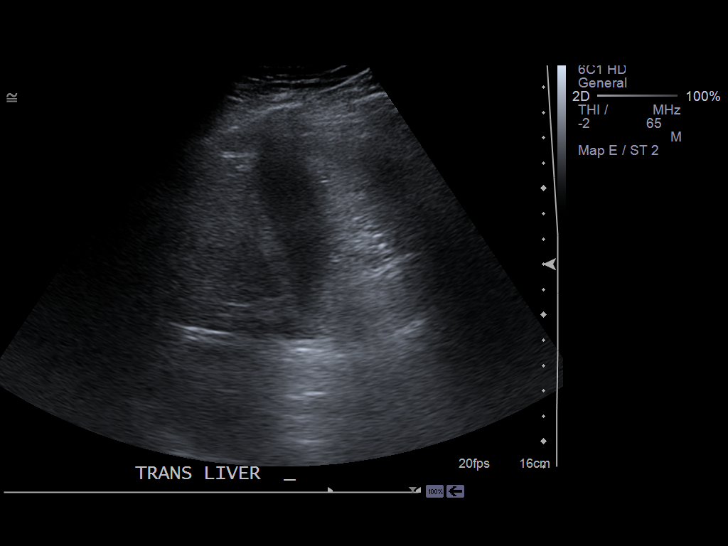
[im 30/40]
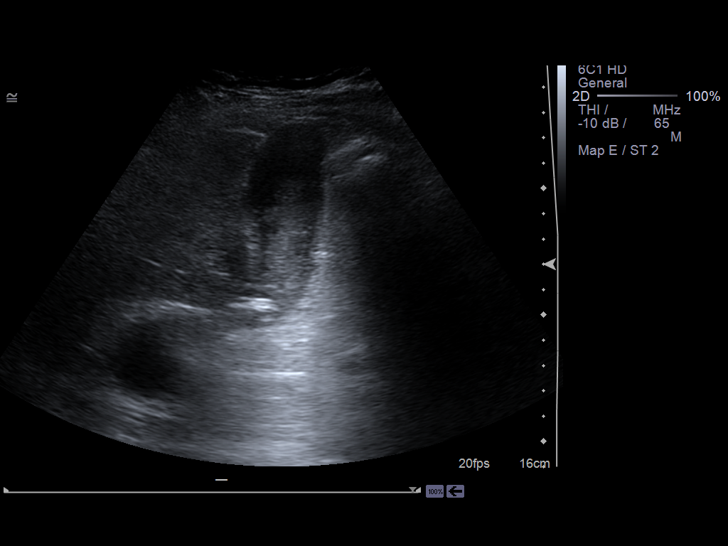
[im 33/40]
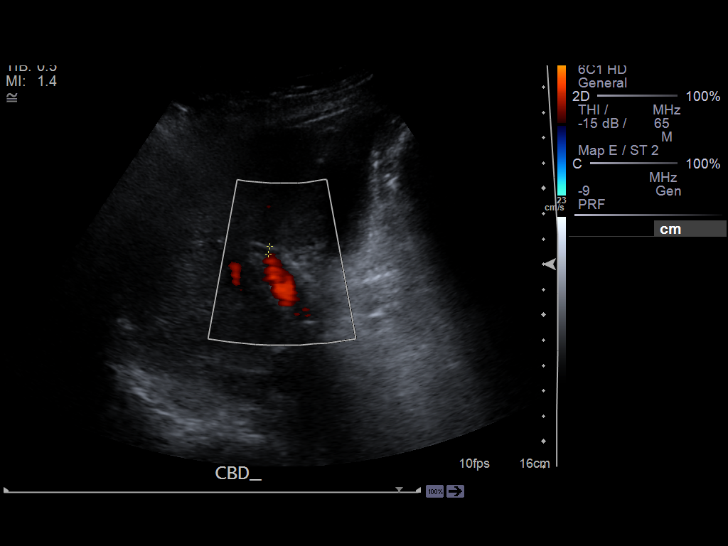
[im 36/40]
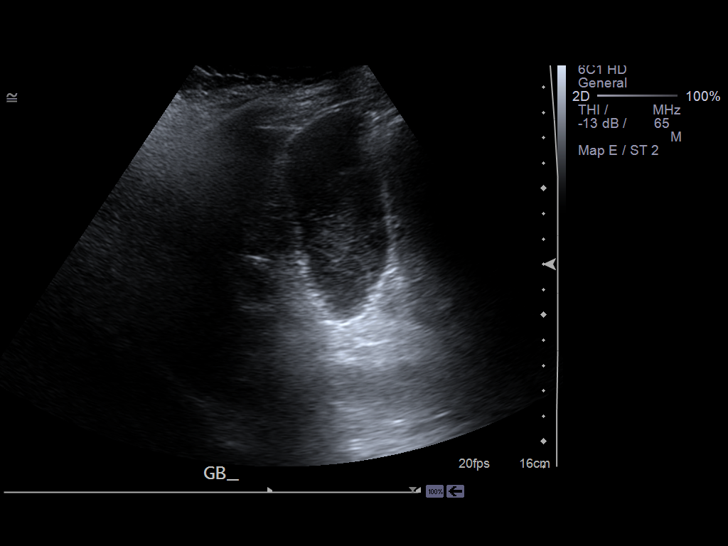
[im 40/40]
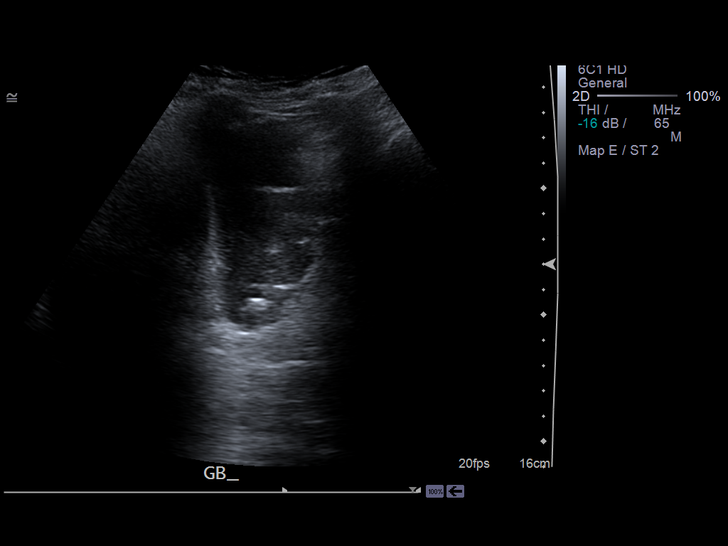

[14 of 25 positions shown; findings below may reference images not displayed]

PROCEDURE:     US  - US ABDOMEN LIMITED SURVEY  - June 20, 2012  [DATE]

RESULT:     Limited right upper quadrant abdominal sonogram is performed.
The pancreas could not be visualized because of overlying bowel gas. The
gallbladder is abnormal. The common bile duct diameter is 3.1 mm. There are
stones and thick sludge within the gallbladder. The gallbladder wall
measures 1.7 mm. There was a negative sonographic Murphy's sign on today's
exam. Portal venous flow is normal. There is no ascites. The visualized
liver is unremarkable.
IMPRESSION: 1. Cholelithiasis with nonmobile stones and thick sludge within the
gallbladder. No definite sonographic evidence of acute cholecystitis.

[REDACTED]

## 2014-08-27 NOTE — Consult Note (Signed)
Impression:     81yo male w/ h/o diabetes and cardiomyopathy, s/p pacemaker placement admitted with Methacillin Resistant Staph aureus bacteremia and possible pneumonia.     His repeat BCx is negative.  A TTE showed no vegetation.  Optimal evaluation would also include a TEE as he has no obvious removable focus, but given his age and overall condition, I would not obtain a TEE.    Agree with linezolid.  Would treat for two weeks.    It is possible that there is an infiltrate on CXR, but it is not very obvious.  He has been on levofloxacin for 6 days.  This should be sufficient to treat CAP.      Continue contact isolation. 5)     If he develops bacteremia in the future, will to consider infection of the pacemaker leads.  Electronic Signatures: Antoin Dargis MPH, Rosalyn GessMichael E (MD) (Signed on 17-Feb-14 17:16)  Authored   Last Updated: 17-Feb-14 17:24 by Kaelin Bonelli MPH, Rosalyn GessMichael E (MD)

## 2014-08-27 NOTE — Consult Note (Signed)
PATIENT NAME:  Jay [name], Jay [name] MR#:  [id number] DATE OF BIRTH:  [birth year]  DATE OF CONSULTATION:  06/23/2012  REFERRING PHYSICIAN:  Dr. Allena Katz.  CONSULTING PHYSICIAN:  Rosalyn Gess. Challis Crill, MD  REASON FOR CONSULTATION:  MRSA bacteremia.   HISTORY OF PRESENT ILLNESS:  The patient is a 81 year old man with a past history significant for diabetes and cardiomyopathy with pacemaker placement who was admitted after a fall at his independent living facility. He also was having worsening agitation. He was admitted to the hospital and found to have MRSA in 1 of 4 bottles of his blood cultures. There was also a question of an infiltrate being present on chest x-ray. His urine culture was negative. The patient has been treated with ceftriaxone initially in the Emergency Room and then levofloxacin. He was given a dose of azithromycin on the first day of admission and then today was started on Zyvox presumably after the blood cultures became positive. He had an echocardiogram that showed no evidence for vegetation and repeat blood cultures have been negative. He was arousable, but appeared confused and was not able to provide any history related to his admission.   ALLERGIES:  PENICILLIN.   PAST MEDICAL HISTORY:   1.  Hypertension.  2.  Diabetes.  3.  Hearing difficulty. 4.  Cardiomyopathy status post pacemaker placement.   SOCIAL HISTORY:  The patient lives in independent living and per the H and P does not smoke nor does he drink.   FAMILY HISTORY:  Positive for diabetes.   REVIEW OF SYSTEMS:  Unable to obtain from the patient due to his confusion.   PHYSICAL EXAMINATION: VITAL SIGNS: T-max 98.8, T-current 97.8, pulse 81, blood pressure 150/71, 93% on room air.  GENERAL: A 81 year old man sleeping in bed, but arousable, moderately confused.  HEENT: Normocephalic, atraumatic. Pupils are equal and reactive to light. Extraocular motion intact. Sclerae, conjunctivae and lids are without evidence for emboli  or petechiae. Oropharynx shows gums are in fair condition.  NECK: Supple. Full range of motion. Midline trachea. No lymphadenopathy. No thyromegaly.  LUNGS: Clear to auscultation bilaterally with good air movement. No focal consolidation.  HEART: Regular rate and rhythm without murmur, rub or gallop.  ABDOMEN: Soft, nontender and nondistended. No hepatosplenomegaly. No hernia is noted.  EXTREMITIES: No evidence for tenosynovitis.  SKIN: No rashes. No stigmata of endocarditis; specifically, no Janeway lesions or Osler nodes.  NEUROLOGIC: The patient was asleep, but arousable. He was confused, but moving all 4 extremities. He could follow simple commands.  PSYCHIATRIC: Mood and affect are pleasant, although he was confused.   DIAGNOSTIC DATA:  BUN was 26 yesterday, creatinine of 1.12. White count yesterday was 8.2 with a hemoglobin 12.0, platelet count 100, ANC 4.4. His white count on admission was 17.1. Blood cultures from admission are growing 1 in 4 bottles positive for MRSA. A urinalysis had negative nitrites, 1+ leukocyte esterase, 9 red cells and 20 white cells per high-powered field. Urine culture was negative. Repeat blood cultures from February 15th showed no growth. A CT of the head without contrast showed no acute abnormalities. Chest x-ray from admission showed a pacemaker present with cardiomegaly. There was diffuse pulmonary interstitial infiltrates thought to be related to possible edema versus bilateral pneumonia. A PA and lateral chest x-ray from February 13th showed pacemaker present, stable cardiomegaly and COPD with chronic interstitial lung disease with a possible superimposed pneumonia. Abdominal ultrasound showed cholelithiasis with nonmobile stones and thick sludge within the gallbladder, but no obvious evidence  of acute cholecystitis. Renal ultrasound showed right renal cyst, but no stones.   IMPRESSION:  A 81 year old male with a history of diabetes and cardiomyopathy status post  pacemaker placement who was admitted with methicillin-resistant Staphylococcus aureus bacteremia and possible pneumonia.   RECOMMENDATIONS:   1.  Repeat blood culture is negative. Transthoracic echocardiogram showed no vegetation. Optimal evaluation will also include a transesophageal echo as he has no obvious removable focus, but given his age and his overall condition, I will not obtain a TEE.  2.  I agree with linezolid. We will treat for 2 weeks.  3.  It is possible that there is an infiltrate on chest x-ray, but it is not very obvious. He has been on levofloxacin for 6 days. This will be sufficient to treat community-acquired pneumonia.  4.  We will continue contact isolation.  5.  If he develops bacteremia again in the future, we will need to consider infection of the pacemaker leads. This is a moderately complicated infectious disease consult.   Thank you very much for involving me in this patient's care.    ____________________________ Rosalyn GessMichael E. Daniil Labarge, MD meb:si D: 06/23/2012 17:24:00 ET T: 06/23/2012 23:50:38 ET JOB#: [id number]  cc: Rosalyn GessMichael E. Mahdi Frye, MD, <Dictator> Tyanne Derocher E Yetta Marceaux MD ELECTRONICALLY SIGNED 06/24/2012 12:00

## 2014-08-27 NOTE — H&P (Signed)
PATIENT NAME:  [name], Jay RamsayRONALD [name] MR#:  [id number] DATE OF BIRTH:  02/15/1914  DATE OF ADMISSION:  06/18/2012  HISTORY OF PRESENT ILLNESS:  The patient is a 81 year old male with a known history of hypertension, diabetes, hearing impairment, congestive heart failure and cardiomyopathy, status post permanent pacemaker placement in 2009. He is being admitted for worsening agitation, status post fall at independent living facility. While in the ED, he was found to have possible pneumonia and/or UTI along with CHF. The patient is unable to provide much history, as he is not able to communicate, lying in the bed, nonverbal, really not following any commands and at times gets agitated. Most of the information is from the medical records and Jay [name]'s discussion with the patient's son while I was evaluating the patient. The patient reportedly fell out of his wheelchair yesterday, was getting more and more confused and had functional decline over the last 4 to 5 weeks per his son. Was sent in to the ER for evaluation of his worsening agitation and confusion. While in the ER, he was found to have leukocytosis and chest x-ray was consistent with pneumonia versus CHF. He also had elevated BNP with a value of 7042 and his UA was worrisome for possible infection. He is being admitted for further evaluation and management.   PAST MEDICAL HISTORY: 1.  Hypertension.  2.  Diabetes.  3.  Hearing difficulty.  4.  Cardiomyopathy, status post permanent pacemaker placement in 2009.   PAST SURGICAL HISTORY: Hemorrhoidal surgery.   ALLERGIES: PENICILLIN.   MEDICATIONS AT HOME: 1.  Calcium with vitamin D 1 tablet p.o. daily.  2.  Coreg 3.125 mg p.o. b.i.d.  3.  Colace 100 mg p.o. daily.  4.  Iron sulfate 325 mg p.o. daily.  5.  Glipizide XL 10 mg p.o. daily.  6.  Isosorbide mononitrate 30 mg p.o. daily. 7.  Januvia 100 mg p.o. daily. 8.  Lasix 20 mg p.o. daily.  9.  Multivitamin once daily.  10. Welchol 625 mg 2 tablets  p.o. b.i.d.   SOCIAL HISTORY: [facility]. No reported  history of smoking or alcohol.   FAMILY HISTORY: Significant for diabetes.   REVIEW OF SYSTEMS: Unobtainable, as the patient is very confused and nonverbal.   PHYSICAL EXAMINATION: VITAL SIGNS: Temperature 98.3, heart rate 78 per minute, respirations 18 per minute, blood pressure 138/70 mmHg. He was saturating 100% on room air.  GENERAL: The patient is a disheveled, ill-appearing male.  HEENT: Head atraumatic, normocephalic. Oropharynx and nasopharynx clear. Difficulty hearing.  NECK: Supple. No jugular venous distention. No thyroid enlargement or tenderness. Trachea midline.  LUNGS: Clear to auscultation bilaterally. No wheezes, rales, rhonchi or crepitation. Decreased breath sounds at the bases.  CARDIOVASCULAR: S1, S2 normal. No murmurs, rubs or gallop.  ABDOMEN: Soft, nontender, nondistended. No organomegaly or masses.  EXTREMITIES: Dry. No cyanosis or clubbing. No edema.  SKIN: No obvious rash, lesion, ulcer. Very dry screen.  NEUROLOGIC: Nonfocal. Not following any commands. Moving all extremities voluntarily.  He is nonverbal.  PSYCHIATRIC: The patient is alert, but at times gets agitated. He is not oriented.   LABORATORY, DIAGNOSTIC, AND RADIOLOGICAL DATA: CBC within normal limits except white count of 17.1 and platelets of 141. CK 1216, troponin 0.17. LFTs within normal limits, except AST of 76. BMP within normal limits, except BUN of 24, creatinine 1.48, blood glucose of 156. BNP of 7042.   CT scan of the head in the Emergency  Department showed no evidence of acute intracranial abnormalities.   Chest x-ray showed pacemaker present with cardiomegaly. Possible edema versus bilateral pneumonia.   Right hip x-ray on February 12 showed no acute fracture.   Pelvic x-ray on February 12 showed DJD in the hip, lumbosacral region and sacroiliac joint without evidence of fracture.    EKG did not show any major ST-T changes.   IMPRESSION AND PLAN: 1. Acute delirium, likely from infection, urinary tract infection and/or pneumonia based on the chest x-ray. Urinary tract infection was based on the UA. We will obtain urine culture and sensitivity, and blood and sputum cultures. We will cover him with Levaquin for now. Monitor him on telemetry. We will provide Haldol for agitation as needed.  2. Pneumonia. Antibiotics as above.  3. Congestive heart failure. His BNP was elevated. Chest x-ray was showing possible edema. This is likely acute on chronic in nature. We will obtain 2-D echo and consult cardiology, get serial cardiac enzymes and start him on IV Lasix; continue Imdur, Coreg. We will hold off ACE inhibitor considering renal failure, monitor his renal function closely and consider ACE-Inhibitor institution if possible.   4. Elevated cardiac enzymes, likely due to supply demand ischemia. Cannot rule out myocardial infarction at this time. We will consult cardiology, start him on baby aspirin and obtain 2-D echo.  5. Urinary tract infection based on urinalysis. Start him on Levaquin, obtain urine culture and sensitivity.  6. Leukocytosis, likely due to infection, pneumonia and/or urinary tract infection.  7. Acute renal failure, likely with underlying chronic kidney disease. We will monitor renal function closely, avoid any nephrotoxins.   CODE STATUS: DO NOT RESUSCITATE.   We will consult palliative care.   TIME SPENT: Total time taking care of this patient is 55 minutes.    ____________________________ Ellamae Sia. Sherryll Burger, MD vss:cc D: 06/18/2012 16:16:00 ET T: 06/18/2012 18:00:38 ET JOB#: [id number]  cc: Steele Sizer, MD Antonieta Iba, MD Raidyn Breiner S. Sherryll Burger, MD, <Dictator>   Ellamae Sia [facility] MD ELECTRONICALLY SIGNED 06/19/2012 12:51

## 2014-08-27 NOTE — Discharge Summary (Signed)
PATIENT NAME:  [name], [name] MR#:  [id number] DATE OF BIRTH:  1914/04/25  DATE OF ADMISSION:  06/18/2012 DATE OF DISCHARGE:  06/26/2012  STAT DISCHARGE ADDENEUM: Please note the patient's discharge diagnoses are the same. He was kept in the hospital because the nursing home required documentation for his son to sign which was not signed. So at this point, all the documentation is in place. The patient continues to be confused and is unchanged. At this time he is stable for discharge to the skilled nursing facility.  TIME SPENT IN ADDITION TO ARRANGING HIS DISCHARGE, FOLLOWUP, ET. CETERA: 75 minutes.   ____________________________ Lacie ScottsShreyang H. Allena KatzPatel, MD shp:jm D: 06/27/2012 11:00:00 ET T: 06/27/2012 11:37:59 ET JOB#: [id number]  cc: Lily Kernen H. Allena KatzPatel, MD, <Dictator> Charise CarwinSHREYANG H Jonel Weldon MD ELECTRONICALLY SIGNED 06/28/2012 12:16

## 2014-08-27 NOTE — Consult Note (Signed)
General Aspect Jay [name] is a 81 yo man with h/o  PNA (1-2 months ago), CHB s/p PPM (2009), h/o CHF, renal insufficiency, HTN, DM, hearing impairment, remote tobacco abuse, s/p hemorrhoid surgery,  last hospitalized in 2009 for pacemaker insertion  with increasing confusion and functional decline over past 4-5 weeks.   He reportedly fell out of his wheelchair on the day prior to admission. He presents to the ER for evaluation of AMS. Found to have leukocytosis and CXR consistent for PNA vs edema.  Full history not available as patient is nonverbal, lying on his side, contractures.   Present Illness . Social Hx Pt is twice widowed. He has a son living and a daughter who is deceased. Pt lives at [facility] in independent living. 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  Family Hx No signiifcant CAD   Physical Exam:  GEN well developed   HEENT pink conjunctivae   NECK No masses  thin   RESP normal resp effort  rhonchi   CARD Regular rate and rhythm  Murmur   Murmur Systolic   ABD soft  thin   EXTR negative edema   NEURO motor/sensory function intact   PSYCH stuporous   Review of Systems:  Review of Systems: All other systems were reviewed and found to be negative   ROS Pt not able to provide ROS   Medications/Allergies Reviewed Medications/Allergies reviewed     Hyperlipidemia:    htn:    Diabetes:    chf:        Admit Diagnosis:   PNEUMONIA: Onset Date: 18-Jun-2012, Status: Active, Description: PNEUMONIA  Home Medications: Medication Instructions Status  carvedilol 3.125 mg oral tablet 1 tab(s) orally 2 times a day Active  GlipiZIDE XL 10 mg oral tablet, extended release 1 tab(s) orally once a day Active  Lasix tablet 20 mg 1 tab(s) orally once a day Active  Colace sodium 100 mg oral capsule 1 cap(s) orally once a day Active  Welchol 625 mg oral tablet 3 tab(s) orally 2 times a day Active  ferrous sulfate 325 mg oral tablet 1 tab(s) orally once a day  Active  multivitamin 1 tab(s) orally once a day Active  Calcium 600+D 600 mg-200 units oral tablet 1 tab(s) orally once a day Active  Januvia 100 mg oral tablet 1 tab(s) orally once a day Active  isosorbide mononitrate 30 mg oral tablet, extended release 1 tab(s) orally once a day Active   Lab Results:  Hepatic:  12-Feb-14 08:50   Bilirubin, Total 1.0  Alkaline Phosphatase 66  SGPT (ALT) 28  SGOT (AST)  76  Total Protein, Serum  8.4  Albumin, Serum 3.7  Routine BB:  12-Feb-14 08:50   ABO Group + Rh Type A Positive  Antibody Screen NEGATIVE (Result(s) reported on 18 Jun 2012 at 10:57AM.)  Routine Chem:  12-Feb-14 08:50   Result Comment TOTAL CK - RESULT OBTAINED BY DILUTION TROPONIN - RESULTS VERIFIED BY REPEAT TESTING.  - C/CHRIS BODILA.1000.06-18-12.VKB  - READ-BACK PROCESS PERFORMED.  B-Type Natriuretic Peptide [facility])  7042 (Result(s) reported on 18 Jun 2012 at 10:33AM.)  Glucose, Serum  156  BUN  24  Creatinine (comp)  1.48  Sodium, Serum 139  Potassium, Serum 4.3  Chloride, Serum 107  CO2, Serum 23  Calcium (Total), Serum 10.1  Osmolality (calc) 285  eGFR (African American)  46  eGFR (Non-African American)  39 (eGFR values <65m/min/1.73 m2 may be an indication of chronic kidney disease (CKD). Calculated  eGFR is useful in patients with stable renal function. The eGFR calculation will not be reliable in acutely ill patients when serum creatinine is changing rapidly. It is not useful in  patients on dialysis. The eGFR calculation may not be applicable to patients at the low and high extremes of body sizes, pregnant women, and vegetarians.)  Anion Gap 9    16:25   Result Comment troponin - RESULTS VERIFIED BY REPEAT TESTING.  - PREVIOUSLY CALLED 1000/06-18-12/BY VKB.SW  Result(s) reported on 18 Jun 2012 at 05:19PM.  Cardiac:  12-Feb-14 08:50   Troponin I  0.17 (0.00-0.05 0.05 ng/mL or less: NEGATIVE  Repeat testing in 3-6 hrs  if clinically indicated. >0.05  ng/mL: POTENTIAL  MYOCARDIAL INJURY. Repeat  testing in 3-6 hrs if  clinically indicated. NOTE: An increase or decrease  of 30% or more on serial  testing suggests a  clinically important change)  CK, Total  1216 (Result(s) reported on 18 Jun 2012 at 09:57AM.)    16:25   Troponin I  0.13 (0.00-0.05 0.05 ng/mL or less: NEGATIVE  Repeat testing in 3-6 hrs  if clinically indicated. >0.05 ng/mL: POTENTIAL  MYOCARDIAL INJURY. Repeat  testing in 3-6 hrs if  clinically indicated. NOTE: An increase or decrease  of 30% or more on serial  testing suggests a  clinically important change)  Routine UA:  12-Feb-14 09:49   Color (UA) Yellow  Clarity (UA) Hazy  Glucose (UA) Negative  Bilirubin (UA) Negative  Ketones (UA) 1+  Specific Gravity (UA) 1.021  Blood (UA) 3+  pH (UA) 5.0  Protein (UA) 100 mg/dL  Nitrite (UA) Negative  Leukocyte Esterase (UA) 1+ (Result(s) reported on 18 Jun 2012 at 10:28AM.)  RBC (UA) 9 /HPF  WBC (UA) 20 /HPF  Bacteria (UA) TRACE  Epithelial Cells (UA) <1 /HPF  Mucous (UA) PRESENT  Hyaline Cast (UA) 1 /LPF  Calcium Oxalate Crystal (UA) PRESENT (Result(s) reported on 18 Jun 2012 at 10:28AM.)  Routine Hem:  12-Feb-14 08:50   WBC (CBC)  17.1  RBC (CBC) 4.44  Hemoglobin (CBC) 13.6  Hematocrit (CBC) 40.9  Platelet Count (CBC)  141  MCV 92  MCH 30.6  MCHC 33.3  RDW  14.6  Neutrophil % 73.1  Lymphocyte % 20.0  Monocyte % 6.4  Eosinophil % 0.0  Basophil % 0.5  Neutrophil #  12.5  Lymphocyte # 3.4  Monocyte #  1.1  Eosinophil # 0.0  Basophil # 0.1 (Result(s) reported on 18 Jun 2012 at 09:21AM.)   EKG:  Interpretation ekg shows V paced rhthm   Radiology Results: XRay:    12-Feb-14 09:22, Chest Portable Single View  Chest Portable Single View   REASON FOR EXAM:    altered mental status  COMMENTS:       PROCEDURE: DXR - DXR PORTABLE CHEST SINGLE VIEW  - Jun 18 2012  9:22AM     RESULT: There is no previous exam for comparison. Left-sided  pacemaker   device is present. There are diffuse patchyareas of increased density   bilaterally which could represent underlying infiltrates or malignancy.   The heart appears to be enlarged. Left ventricle prominence is present.   The bony structures appear intact.    IMPRESSION:   1. Pacemaker present with cardiomegaly. Diffuse pulmonary interstitial   infiltrates which could represent edema or bilateral pneumonia. Correlate   with clinical and laboratory data.  Dictation Site: 2        Verified By: Sundra Aland, M.D., MD  PCN: Rash  Vital Signs/Nurse's Notes: **Vital Signs.:   12-Feb-14 12:13  Vital Signs Type Admission  Temperature Temperature (F) 97.4  Celsius 36.3  Temperature Source axillary  Pulse Pulse 64  Respirations Respirations 18  Systolic BP Systolic BP 749  Diastolic BP (mmHg) Diastolic BP (mmHg) 65  Mean BP 86  Pulse Ox % Pulse Ox % 93  Pulse Ox Activity Level  At rest  Oxygen Delivery Room Air/ 21 %    Impression 81 yo male with history of CHF, Hypertension, DM, poor hearing,   sent from [facility] independent living facility status post fall with worsening agitation and confusion,  admitted for Pneumonia.  1. Acute delirium/Mental status change  likely from infection - Urinary Tract Infection (based on urinalysis) On  levaquin, blood and sputum c/s Unable to exclude PNA  2. Pneumonia:  Based on CXR. Clinical presentation unclear as he is noncommunicative, not coughing, difficult exam antibiotic as above,   3. CHF:  BNP high, though renal function concerning for dehydration.  Elevated BNP could be from underlying lung disease, nonspecific Given clinical presentation, poor po intake, will check echo, would hold on lasix while he has no po intake  4. Elevated Cardiac Enzymes:  demand ischemia, CK out of proportion to troponin, concerning for mild rhabdo minimal elevation of troponin Not a candidate for cath/stress test No  anticoagulation needed as troponin trending down DNR  5. Urinary Tract Infection:  based on urinalysis, levaquin, urine c/s  6. leukocytosis:  underlying infection   Electronic Signatures: Ida Rogue (MD)  (Signed 12-Feb-14 18:50)  Authored: General Aspect/Present Illness, History and Physical Exam, Review of System, Past Medical History, Health Issues, Home Medications, Labs, EKG , Radiology, Allergies, Vital Signs/Nurse's Notes, Impression/Plan   Last Updated: 12-Feb-14 18:50 by Ida Rogue (MD)

## 2014-08-27 NOTE — Consult Note (Signed)
   Comments   Spoke with pt's son at length. He feels that pt has waxing and waning status. We talked about the possibility that patient may not do well given his continued poor mental status and poor oral intake. We talked about the option of the Hospice Home for symptom management and possible end of life care. He would like to speak with hospice staff for more information. Will place screening.  15 minutes  Electronic Signatures: Borders, Daryl EasternJoshua R (NP)  (Signed 19-Feb-14 10:52)  Authored: Palliative Care Phifer, Harriett SineNancy (MD)  (Signed 19-Feb-14 15:01)  Authored: Palliative Care   Last Updated: 19-Feb-14 15:01 by Phifer, Harriett SineNancy (MD)

## 2014-08-27 NOTE — Discharge Summary (Signed)
PATIENT NAME:  Jay [name] MR#:  [id number] DATE OF BIRTH:  09/17/1913  DATE OF ADMISSION:  06/18/2012 DATE OF DISCHARGE:  06/26/2012  ADMITTING DIAGNOSIS: Worsening delirium.   DISCHARGE DIAGNOSES: 1.  Acute delirium felt to be due to a combination of urinary tract infection as well as pneumonia. He continues to be delirious.  2.  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 Unfortunately with his advanced age, the patient has failed to show any improvement.  3.  Possible aspiration pneumonia.  4.  MRSA bacteremia 1 out of 4 blood cultures. Treated with vancomycin. He will finish course with Zyvox. A 2-D echocardiogram shows no evidence of endocarditis.  5.  Elevated troponin, likely demand ischemia.  6.  Renal insufficiency.  7.  Pyuria and hematuria. The patient continues to require a Foley. Unfortunately, he likely has chronic urinary retention.  8.  Diabetes.  9.  Difficulty with hearing.  10.  Cardiomyopathy, status post permanent pacemaker in 2009.  11.  Status post hemorrhoidal surgery.   PERTINENT LABORATORIES AND EVALUATIONS: Troponin on admission 0.12, magnesium 1.6. INR 1.1. BMP: Glucose 126, BUN 23, creatinine 1.46, sodium 145, potassium 3.7, chloride 111, CO2 22. LFTs showed AST of 65, albumin 3.1. Admitting WBC count 16.0, hemoglobin 12.6, platelet count 114.   PA and lateral chest x-ray:  Left-sided pacemaker, stable cardiomegaly, COPD with chronic interstitial disease and possible superimposed pneumonia.   Right upper quadrant ultrasound shows cholelithiasis without evidence of cholecystitis. Bilateral kidneys showed cyst in the kidneys.   WBC count on February 15 was 8.4. Blood cultures on presentation showed methicillin-resistant Staph aureus. Subsequent blood cultures on February 15 were negative.   HOSPITAL COURSE: Please refer to H and P done by the admitting physician. The patient is a 81 year old male with history of hypertension, diabetes, hearing  impairment, CHF and cardiomyopathy, who has had problems with worsening agitation over the past 45 days according to the family. He was living in an independent facility. The patient was found to have a possible pneumonia as well as UTI, and we were asked to admit the patient.   The patient was treated for these problems yet continued to not improve. In terms of his delirium, he continues to have problems with agitation and delirium. The patient was treated with Levaquin and his blood cultures did come back positive for MRSA.   He had a 2-D echocardiogram which failed to show endocarditis. He has been afebrile and his WBC count is normal. In terms of infectious standpoint, he has not had any fevers; however, he has not shown any significant improvement. He was seen by palliative care. Discussions were made with the family. He is no code, DO NOT RESUSCITATE. They were questioning whether he should be transferred to a hospice facility or to a skilled nursing facility.   He is being transferred to a skilled nursing facility per sons request. There is a very good chance that patient is nearing the end of his life, and if he fails to improve should consider complete comfort care.  DISCHARGE MEDICATIONS: Carvedilol 3.125, one tab p.o. b.i.d., glipizide XL 10 daily, Lasix 20 daily, Colace 100, one tab p.o. daily, Welchol 625 three tabs 2 times per day, iron sulfate 325 mg p.o. daily, multivitamin 1 tab p.o. daily, calcium plus vitamin D 1 tab p.o. daily, Januvia 100, one tab p.o. daily, isosorbide mononitrate 30 daily, Tylenol 650 mg every 4 hours p.[name].n., Nystatin 1 application topically b.i.d., Protonix 40 daily, Levaquin 500 p.o. daily  x5 times days.    DIET: Low sodium.   DIET CONSISTENCY: Puree, nectar, thick liquids. Take aspiration precautions.   FOLLOWUP:  With primary M.D. in 1 to 2 weeks.   CODE STATUS:  The patient is DO NOT RESUSCITATE and has progressive decline. Family is aware and was seen by  hospice in the hospital.   TIME SPENT: 35 minutes spent.      ____________________________ Lacie Scotts. Allena Katz, MD shp:cc D: 06/26/2012 15:39:00 ET T: 06/26/2012 16:03:11 ET JOB#: [id number]  cc: Collyns Mcquigg H. Allena Katz, MD, <Dictator> Charise Carwin MD ELECTRONICALLY SIGNED 06/28/2012 12:16
